# Patient Record
Sex: Female | Born: 1935 | Race: White | Hispanic: No | Smoking: Never smoker
Health system: Southern US, Community
[De-identification: ages and names within clinical notes are randomized; demographics above are authoritative.]

## PROBLEM LIST (undated history)

## (undated) DIAGNOSIS — H579 Unspecified disorder of eye and adnexa: Secondary | ICD-10-CM

## (undated) DIAGNOSIS — M84353A Stress fracture, unspecified femur, initial encounter for fracture: Secondary | ICD-10-CM

## (undated) DIAGNOSIS — M1990 Unspecified osteoarthritis, unspecified site: Secondary | ICD-10-CM

## (undated) DIAGNOSIS — H9193 Unspecified hearing loss, bilateral: Secondary | ICD-10-CM

## (undated) DIAGNOSIS — M9901 Segmental and somatic dysfunction of cervical region: Secondary | ICD-10-CM

## (undated) DIAGNOSIS — M810 Age-related osteoporosis without current pathological fracture: Secondary | ICD-10-CM

## (undated) DIAGNOSIS — I10 Essential (primary) hypertension: Secondary | ICD-10-CM

## (undated) DIAGNOSIS — Z87442 Personal history of urinary calculi: Secondary | ICD-10-CM

## (undated) HISTORY — PX: RETINAL DETACHMENT SURGERY: SHX105

## (undated) HISTORY — PX: CHOLECYSTECTOMY: SHX55

## (undated) HISTORY — DX: Unspecified osteoarthritis, unspecified site: M19.90

## (undated) HISTORY — DX: Essential (primary) hypertension: I10

## (undated) HISTORY — PX: EYE SURGERY: SHX253

---

## 1987-04-09 HISTORY — PX: OTHER SURGICAL HISTORY: SHX169

## 1998-06-05 ENCOUNTER — Other Ambulatory Visit: Admission: RE | Admit: 1998-06-05 | Discharge: 1998-06-05 | Payer: Self-pay | Admitting: *Deleted

## 1999-06-14 ENCOUNTER — Other Ambulatory Visit: Admission: RE | Admit: 1999-06-14 | Discharge: 1999-06-14 | Payer: Self-pay | Admitting: *Deleted

## 2000-07-02 ENCOUNTER — Other Ambulatory Visit: Admission: RE | Admit: 2000-07-02 | Discharge: 2000-07-02 | Payer: Self-pay | Admitting: *Deleted

## 2001-07-28 ENCOUNTER — Other Ambulatory Visit: Admission: RE | Admit: 2001-07-28 | Discharge: 2001-07-28 | Payer: Self-pay | Admitting: *Deleted

## 2002-09-23 ENCOUNTER — Ambulatory Visit (HOSPITAL_COMMUNITY): Admission: RE | Admit: 2002-09-23 | Discharge: 2002-09-24 | Payer: Self-pay | Admitting: Ophthalmology

## 2002-09-23 ENCOUNTER — Encounter: Payer: Self-pay | Admitting: Ophthalmology

## 2003-04-09 DIAGNOSIS — M84353A Stress fracture, unspecified femur, initial encounter for fracture: Secondary | ICD-10-CM

## 2003-04-09 DIAGNOSIS — H579 Unspecified disorder of eye and adnexa: Secondary | ICD-10-CM

## 2003-04-09 HISTORY — DX: Unspecified disorder of eye and adnexa: H57.9

## 2003-04-09 HISTORY — PX: ENUCLEATION: SHX628

## 2003-04-09 HISTORY — DX: Stress fracture, unspecified femur, initial encounter for fracture: M84.353A

## 2008-04-05 ENCOUNTER — Ambulatory Visit: Payer: Self-pay | Admitting: *Deleted

## 2010-08-21 NOTE — Procedures (Signed)
DUPLEX DEEP VENOUS EXAM - LOWER EXTREMITY   INDICATION:  Left leg swelling.   HISTORY:  Edema:  Several weeks of left leg edema.  Trauma/Surgery:  No.  Pain:  Left leg pain.  PE:  No.  Previous DVT:  No.  Anticoagulants:  No.  Other:   DUPLEX EXAM:                CFV   SFV   PopV  PTV    GSV                R  L  R  L  R  L  R   L  R  L  Thrombosis    0  0     0     0      0     0  Spontaneous   +  +     +     +      +     +  Phasic        +  +     +     +      +     +  Augmentation  +  +     +     +      +     +  Compressible  +  +     +     +      +     +  Competent     0  0     0     0      0     0   Legend:  + - yes  o - no  p - partial  D - decreased   IMPRESSION:  1. No evidence of deep or superficial vein thrombus or Baker's cyst      bilaterally.  2. Deep vein reflux is seen throughout the left leg and right common      femoral vein.    _____________________________  P. Liliane Bade, M.D.   MC/MEDQ  D:  04/05/2008  T:  04/05/2008  Job:  [id number]

## 2010-08-24 NOTE — Op Note (Signed)
   NAME:  Owen, Rhonda                           ACCOUNT NO.:  [id number]   MEDICAL RECORD NO.:  [id number]                   PATIENT TYPE:  OIB   LOCATION:  2550                                 FACILITY:  [facility]   PHYSICIAN:  Beulah Gandy. Ashley Royalty, M.D.              DATE OF BIRTH:  [birth year]   DATE OF PROCEDURE:  09/23/2002  DATE OF DISCHARGE:                                 OPERATIVE REPORT   PREOPERATIVE DIAGNOSIS:  Exposed and infected implant, left eye.   PROCEDURE:  1. Removal of scleral buckle material and sutures, left eye.  2. Repair of conjunctival defect, left eye.   SURGEON:  Beulah Gandy. Ashley Royalty, M.D.   ASSISTANT:  Merian Capron, MA.   ANESTHESIA:  General.   DESCRIPTION OF PROCEDURE:  After the usual prep and drape, a peritomy at 360  degree limbus peritomy, isolation of lateral rectus muscle.  Dissection down  to the scleral buckle for 360 degrees.  The scleral band was trimmed and  removed in one single piece from the 4:00 position.  The conjunctiva had  large major defects in the upper temporal and lower temporal quadrants.  Four interrupted 4-0 [place] sutures were identified and removed in their  entirety.  No additional sutures were present in the buckle path.  Scleral  buckle material was cut back in several locations and removed from the eye.  The conjunctiva was then reposited with 7-0 chromic suture.  Some of the  conjunctiva was undermined laterally, superiorly and inferiorly to advance  towards the defect edge.  7-0 chromic suture was used to close the defects  and reattach the conjunctiva to the limbus.  Once this was accomplished,  Atropine solution was applied.  The indirect ophthalmoscope laser was moved  into place and 231 burns were placed around the retinal periphery with the  power of 400 milliwatts, 1000 microns each and 0.1 seconds each.  Polymixin  and gentamycin were irrigated into tenon's space.  Atropine solution was  applied.  Decadron 10 mg was  injected into the lower subconjunctival space.  Marcaine was injected around the globe.  The closing tension was soft and  would not register on the [employer].   COMPLICATIONS:  None.   DURATION:  One hour.                                               Beulah Gandy. Ashley Royalty, M.D.    JDM/MEDQ  D:  09/23/2002  T:  09/25/2002  Job:  [id number]

## 2011-07-03 DIAGNOSIS — E785 Hyperlipidemia, unspecified: Secondary | ICD-10-CM | POA: Diagnosis not present

## 2011-07-03 DIAGNOSIS — I10 Essential (primary) hypertension: Secondary | ICD-10-CM | POA: Diagnosis not present

## 2011-07-03 DIAGNOSIS — M810 Age-related osteoporosis without current pathological fracture: Secondary | ICD-10-CM | POA: Diagnosis not present

## 2011-07-03 DIAGNOSIS — M4000 Postural kyphosis, site unspecified: Secondary | ICD-10-CM | POA: Diagnosis not present

## 2011-07-03 DIAGNOSIS — Z79899 Other long term (current) drug therapy: Secondary | ICD-10-CM | POA: Diagnosis not present

## 2011-08-14 DIAGNOSIS — Z1231 Encounter for screening mammogram for malignant neoplasm of breast: Secondary | ICD-10-CM | POA: Diagnosis not present

## 2011-09-24 DIAGNOSIS — H02059 Trichiasis without entropian unspecified eye, unspecified eyelid: Secondary | ICD-10-CM | POA: Diagnosis not present

## 2011-09-24 DIAGNOSIS — H4011X0 Primary open-angle glaucoma, stage unspecified: Secondary | ICD-10-CM | POA: Diagnosis not present

## 2012-01-28 DIAGNOSIS — Z23 Encounter for immunization: Secondary | ICD-10-CM | POA: Diagnosis not present

## 2012-03-10 DIAGNOSIS — H4011X0 Primary open-angle glaucoma, stage unspecified: Secondary | ICD-10-CM | POA: Diagnosis not present

## 2012-03-10 DIAGNOSIS — H02059 Trichiasis without entropian unspecified eye, unspecified eyelid: Secondary | ICD-10-CM | POA: Diagnosis not present

## 2012-03-16 DIAGNOSIS — Z1331 Encounter for screening for depression: Secondary | ICD-10-CM | POA: Diagnosis not present

## 2012-03-16 DIAGNOSIS — E785 Hyperlipidemia, unspecified: Secondary | ICD-10-CM | POA: Diagnosis not present

## 2012-03-16 DIAGNOSIS — I10 Essential (primary) hypertension: Secondary | ICD-10-CM | POA: Diagnosis not present

## 2012-03-16 DIAGNOSIS — Z79899 Other long term (current) drug therapy: Secondary | ICD-10-CM | POA: Diagnosis not present

## 2012-08-14 DIAGNOSIS — Z1231 Encounter for screening mammogram for malignant neoplasm of breast: Secondary | ICD-10-CM | POA: Diagnosis not present

## 2012-09-10 DIAGNOSIS — H02059 Trichiasis without entropian unspecified eye, unspecified eyelid: Secondary | ICD-10-CM | POA: Diagnosis not present

## 2012-09-10 DIAGNOSIS — H4011X0 Primary open-angle glaucoma, stage unspecified: Secondary | ICD-10-CM | POA: Diagnosis not present

## 2012-09-10 DIAGNOSIS — Z961 Presence of intraocular lens: Secondary | ICD-10-CM | POA: Diagnosis not present

## 2012-12-21 DIAGNOSIS — E785 Hyperlipidemia, unspecified: Secondary | ICD-10-CM | POA: Diagnosis not present

## 2012-12-21 DIAGNOSIS — I10 Essential (primary) hypertension: Secondary | ICD-10-CM | POA: Diagnosis not present

## 2012-12-21 DIAGNOSIS — Z79899 Other long term (current) drug therapy: Secondary | ICD-10-CM | POA: Diagnosis not present

## 2012-12-21 DIAGNOSIS — Z23 Encounter for immunization: Secondary | ICD-10-CM | POA: Diagnosis not present

## 2013-03-18 DIAGNOSIS — H4011X0 Primary open-angle glaucoma, stage unspecified: Secondary | ICD-10-CM | POA: Diagnosis not present

## 2013-08-18 DIAGNOSIS — Z1231 Encounter for screening mammogram for malignant neoplasm of breast: Secondary | ICD-10-CM | POA: Diagnosis not present

## 2013-08-24 DIAGNOSIS — N63 Unspecified lump in unspecified breast: Secondary | ICD-10-CM | POA: Diagnosis not present

## 2013-08-25 DIAGNOSIS — N63 Unspecified lump in unspecified breast: Secondary | ICD-10-CM | POA: Diagnosis not present

## 2013-08-25 DIAGNOSIS — C50919 Malignant neoplasm of unspecified site of unspecified female breast: Secondary | ICD-10-CM | POA: Diagnosis not present

## 2013-08-27 ENCOUNTER — Encounter: Payer: Self-pay | Admitting: *Deleted

## 2013-08-27 ENCOUNTER — Telehealth: Payer: Self-pay | Admitting: *Deleted

## 2013-08-27 DIAGNOSIS — C50411 Malignant neoplasm of upper-outer quadrant of right female breast: Secondary | ICD-10-CM | POA: Insufficient documentation

## 2013-08-27 NOTE — Telephone Encounter (Signed)
Confirmed [facility] for 09/01/13 at 1230.  Instructions and contact information given.

## 2013-08-31 ENCOUNTER — Telehealth: Payer: Self-pay | Admitting: *Deleted

## 2013-08-31 NOTE — Telephone Encounter (Signed)
Pt called and would like to see Dr. Lucia Gaskins as Psychologist, sport and exercise.  Pt scheduled to see Dr. Lucia Gaskins on 09/16/13.  Informed pt she does not need to come the [facility] on 09/01/13. Received verbal understanding.

## 2013-09-13 DIAGNOSIS — H40159 Residual stage of open-angle glaucoma, unspecified eye: Secondary | ICD-10-CM | POA: Diagnosis not present

## 2013-09-13 DIAGNOSIS — H26499 Other secondary cataract, unspecified eye: Secondary | ICD-10-CM | POA: Diagnosis not present

## 2013-09-16 ENCOUNTER — Encounter (INDEPENDENT_AMBULATORY_CARE_PROVIDER_SITE_OTHER): Payer: Self-pay | Admitting: Surgery

## 2013-09-16 ENCOUNTER — Ambulatory Visit (INDEPENDENT_AMBULATORY_CARE_PROVIDER_SITE_OTHER): Payer: Medicare Other | Admitting: Surgery

## 2013-09-16 VITALS — BP 126/74 | HR 74 | Temp 97.4°F | Ht <= 58 in | Wt 96.0 lb

## 2013-09-16 DIAGNOSIS — C50419 Malignant neoplasm of upper-outer quadrant of unspecified female breast: Secondary | ICD-10-CM

## 2013-09-16 DIAGNOSIS — C50411 Malignant neoplasm of upper-outer quadrant of right female breast: Secondary | ICD-10-CM

## 2013-09-16 NOTE — Progress Notes (Addendum)
Re:   Rhonda Owen DOB:   1914/05/01 MRN:   [id number]  ASSESSMENT AND PLAN: 1.  Right breast cancer, 10 o'clock  Biopsy ([id number]) - 08/25/2013 - Invasive ductal ca, ER - 100%, PR - 0%, Ki67 - 10%, Her 2 Neu - neg  I discussed the options for breast cancer treatment with the patient.  I discussed a multidisciplinary approach to the treatment of breast cancer, which includes medical oncology and radiation oncology.  I discussed the surgical options of lumpectomy vs. mastectomy.   I discussed the options of lymph node biopsy.    The risks of surgery include, but are not limited to, bleeding, infection, the need for further surgery, and nerve injury.  At her age, the less done the better.  I discussed options of 1) doing nothing, 2) being put on a pill (tamoxifen), 3) local excision of the cancer.  I would favor local excision of the cancer.  I would need some way to localize the tumor (wire or seed).  But the surgery could probably be done under local.  At this time, the patient is undecided what to do.  She is going home to think about it.  If she does not have surgery, I have encouraged her to come back to me in 6 months.  [The patient called back and wants to go ahead with surgery.  The phone number in the chart is her sister's, Florence Canner.  Ms. Kercher phone number is [phone].  I talked to Earlie Server and will will speak to Ms. Wert later today ... I spoke to Ms. Sheilds and discussed the surgery and answered questions.  Will schedule right lumpectomy (needle loc)  DN 11/29/2013]  2.  Absent left eye 3.  Cannot support head 4.  HTN 5.  Arthritis  Chief Complaint  Patient presents with  . eval breast ca   REFERRING PHYSICIAN: Gerrit Heck, MD  HISTORY OF PRESENT ILLNESS: Rhonda Owen is a 80 y.o. (DOB: [birth year])  white  female whose primary care physician is Gerrit Heck, MD and comes to me today for right breast cancer. Her sister and son, Saralyn Pilar,  came with her.  She went screening mammography at [facility] on 08/18/2013.  This prompted a biopsy of a mass found in her right breast.  The mass proved to be cancer. Biopsy ([id number]) - 08/25/2013 - Invasive ductal ca, ER - 100%, PR - 0%, Ki67 - 10%, Her 2 Neu - neg So she is here with her sister and son to discuss the options of treatment.  She lives by herself.   Past Medical History  Diagnosis Date  . Arthritis   . Hypertension     Past Surgical History  Procedure Laterality Date  . Cholecystectomy    . Eye surgery      Current Outpatient Prescriptions  Medication Sig Dispense Refill  . amLODipine (NORVASC) 5 MG tablet Take 5 mg by mouth daily.      Marland Kitchen aspirin 81 MG tablet Take 81 mg by mouth daily.      . calcium-vitamin D (OSCAL WITH D) 500-200 MG-UNIT per tablet Take 1 tablet by mouth.      . furosemide (LASIX) 20 MG tablet Take 20 mg by mouth.      . losartan (COZAAR) 50 MG tablet Take 50 mg by mouth daily.      . Multiple Vitamin (MULTIVITAMIN) tablet Take 1 tablet by mouth daily.      . raloxifene (EVISTA) 60 MG tablet Take 60  mg by mouth daily.       No current facility-administered medications for this visit.     No Known Allergies  REVIEW OF SYSTEMS: Skin:  No history of rash.  No history of abnormal moles. Infection:  No history of hepatitis or HIV.  No history of MRSA. 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 Cardiac:  No history of hypertension. No history of heart disease.  No history of prior cardiac catheterization.  No history of seeing a cardiologist. Pulmonary:  Does not smoke cigarettes.  No asthma or bronchitis.  No OSA/CPAP.  Endocrine:  No diabetes. No thyroid disease. Gastrointestinal:  No history of stomach disease.  No history of liver disease.  No history of gall bladder disease.  No history of pancreas disease.  No history of colon disease. Urologic:  No history of kidney stones.  No history of bladder infections. Musculoskeletal:  Kyphotic  and cannot support head with neck muscles.  History of stress fracture right hip - 2009. Hematologic:  No bleeding disorder.  No history of anemia.  Not anticoagulated. Psycho-social:  The patient is oriented.   Mentally she is pretty sharp.  SOCIAL and FAMILY HISTORY: Shon Baton ([phone]) and son with patient. Son Saralyn Pilar [phone]) Her sister was Zella Ball that I saw for years (who is now dead).  PHYSICAL EXAM: BP 126/74  Pulse 74  Temp(Src) 97.4 F (36.3 C)  Ht 4' 9"  (1.448 m)  Wt 96 lb (43.545 kg)  BMI 20.77 kg/m2  General: Thin older WF who is alert. Her head flops to the left and she supports it with her left hand.  HEENT: Normal. Absent left eye. Neck: Supple. No mass.  No thyroid mass. Lymph Nodes:  No supraclavicular, cervical, or axillary nodes. Lungs: Clear to auscultation and symmetric breath sounds. Heart:  RRR. No murmur or rub. Breast:  Right:  Ptotic, no mass that I can feel.  She has a puncture site at 9 o'clock.  Left:  Ptotic, no mass  Abdomen: Soft. No mass. No tenderness. No hernia. Normal bowel sounds.  Extremities:  She uses a walker. Neurologic:  Grossly intact to motor and sensory function. Psychiatric: Behavior is normal.   DATA REVIEWED: Epic notes. I gave them copies of the path report.  Alphonsa Overall, MD,  [facility], [address]  [place], [place]    [place] Phone:  [phone] FAX:  [phone]

## 2013-10-22 ENCOUNTER — Telehealth (INDEPENDENT_AMBULATORY_CARE_PROVIDER_SITE_OTHER): Payer: Self-pay

## 2013-10-22 NOTE — Telephone Encounter (Signed)
F/U call . Patient sister Florence Canner  states she is patients caregiver and she will going out of town , she will call us next month to discuss surgery options .

## 2013-11-10 ENCOUNTER — Encounter (INDEPENDENT_AMBULATORY_CARE_PROVIDER_SITE_OTHER): Payer: Self-pay

## 2013-11-29 NOTE — Addendum Note (Signed)
Addended by: Shann Medal on: 11/29/2013 01:30 PM   Modules accepted: Orders

## 2013-12-30 ENCOUNTER — Encounter (HOSPITAL_COMMUNITY): Payer: Self-pay | Admitting: Pharmacy Technician

## 2013-12-31 ENCOUNTER — Ambulatory Visit (HOSPITAL_COMMUNITY)
Admission: RE | Admit: 2013-12-31 | Discharge: 2013-12-31 | Disposition: A | Discharge status: Home / Self Care | Payer: Medicare Other | Source: Ambulatory Visit | Attending: Anesthesiology | Admitting: Anesthesiology

## 2013-12-31 ENCOUNTER — Encounter (HOSPITAL_COMMUNITY): Payer: Self-pay

## 2013-12-31 ENCOUNTER — Encounter (HOSPITAL_COMMUNITY)
Admission: RE | Admit: 2013-12-31 | Discharge: 2013-12-31 | Disposition: A | Discharge status: Home / Self Care | Payer: Medicare Other | Source: Ambulatory Visit | Attending: Surgery | Admitting: Surgery

## 2013-12-31 DIAGNOSIS — M8448XA Pathological fracture, other site, initial encounter for fracture: Secondary | ICD-10-CM | POA: Diagnosis not present

## 2013-12-31 DIAGNOSIS — C50919 Malignant neoplasm of unspecified site of unspecified female breast: Secondary | ICD-10-CM | POA: Diagnosis not present

## 2013-12-31 HISTORY — DX: Stress fracture, unspecified femur, initial encounter for fracture: M84.353A

## 2013-12-31 HISTORY — DX: Unspecified disorder of eye and adnexa: H57.9

## 2013-12-31 HISTORY — DX: Unspecified hearing loss, bilateral: H91.93

## 2013-12-31 HISTORY — DX: Age-related osteoporosis without current pathological fracture: M81.0

## 2013-12-31 HISTORY — DX: Segmental and somatic dysfunction of cervical region: M99.01

## 2013-12-31 HISTORY — DX: Personal history of urinary calculi: Z87.442

## 2013-12-31 LAB — CBC
HCT: 37.4 % (ref 36.0–46.0)
Hemoglobin: 12.4 g/dL (ref 12.0–15.0)
MCH: 30.6 pg (ref 26.0–34.0)
MCHC: 33.2 g/dL (ref 30.0–36.0)
MCV: 92.3 fL (ref 78.0–100.0)
Platelets: 236 10*3/uL (ref 150–400)
RBC: 4.05 MIL/uL (ref 3.87–5.11)
RDW: 14.2 % (ref 11.5–15.5)
WBC: 6.5 10*3/uL (ref 4.0–10.5)

## 2013-12-31 LAB — BASIC METABOLIC PANEL
Anion gap: 9 (ref 5–15)
BUN: 17 mg/dL (ref 6–23)
CO2: 30 mEq/L (ref 19–32)
CREATININE: 0.67 mg/dL (ref 0.50–1.10)
Calcium: 9.8 mg/dL (ref 8.4–10.5)
Chloride: 101 mEq/L (ref 96–112)
GFR calc Af Amer: 83 mL/min — ABNORMAL LOW (ref >90)
GFR, EST NON AFRICAN AMERICAN: 72 mL/min — AB (ref >90)
Glucose, Bld: 90 mg/dL (ref 70–99)
Potassium: 4.4 mEq/L (ref 3.7–5.3)
SODIUM: 140 meq/L (ref 137–147)

## 2013-12-31 NOTE — Pre-Procedure Instructions (Signed)
Rhonda Owen  12/31/2013   Your procedure is scheduled on: Friday, January 07, 2014  Report to [facility] Admitting at 9:00 AM.  Call this number if you have problems the morning of surgery: [phone]   Remember:   Do not eat food or drink liquids after midnight Thursday, January 06, 2014   Take these medicines the morning of surgery with A SIP OF WATER: amLODipine (NORVASC),  raloxifene (EVISTA)   Stop taking Aspirin, vitamins, and herbal medications. Do not take any NSAIDs ie: Ibuprofen, Advil, Naproxen or any medication containing Aspirin; stop now.   Do not wear jewelry, make-up or nail polish.  Do not wear lotions, powders, or perfumes. You may not wear deodorant.  Do not shave 48 hours prior to surgery. Men may shave face and neck.  Do not bring valuables to the hospital.  [facility] is not responsible  for any belongings or valuables.               Contacts, dentures or bridgework may not be worn into surgery.  Leave suitcase in the car. After surgery it may be brought to your room.  For patients admitted to the hospital, discharge time is determined by your treatment team.               Patients discharged the day of surgery will not be allowed to drive home.  Name and phone number of your driver:       Special Instructions:  Special Instructions:Special Instructions: [facility] - Preparing for Surgery  Before surgery, you can play an important role.  Because skin is not sterile, your skin needs to be as free of germs as possible.  You can reduce the number of germs on you skin by washing with CHG (chlorahexidine gluconate) soap before surgery.  CHG is an antiseptic cleaner which kills germs and bonds with the skin to continue killing germs even after washing.  Please DO NOT use if you have an allergy to CHG or antibacterial soaps.  If your skin becomes reddened/irritated stop using the CHG and inform your nurse when you arrive at Short Stay.  Do not shave  (including legs and underarms) for at least 48 hours prior to the first CHG shower.  You may shave your face.  Please follow these instructions carefully:   1.  Shower with CHG Soap the night before surgery and the morning of Surgery.  2.  If you choose to wash your hair, wash your hair first as usual with your normal shampoo.  3.  After you shampoo, rinse your hair and body thoroughly to remove the Shampoo.  4.  Use CHG as you would any other liquid soap.  You can apply chg directly  to the skin and wash gently with scrungie or a clean washcloth.  5.  Apply the CHG Soap to your body ONLY FROM THE NECK DOWN.  Do not use on open wounds or open sores.  Avoid contact with your eyes, ears, mouth and genitals (private parts).  Wash genitals (private parts) with your normal soap.  6.  Wash thoroughly, paying special attention to the area where your surgery will be performed.  7.  Thoroughly rinse your body with warm water from the neck down.  8.  DO NOT shower/wash with your normal soap after using and rinsing off the CHG Soap.  9.  Pat yourself dry with a clean towel.  10.  Wear clean pajamas.            11.  Place clean sheets on your bed the night of your first shower and do not sleep with pets.  Day of Surgery  Do not apply any lotions/deodorants the morning of surgery.  Please wear clean clothes to the hospital/surgery center.   Please read over the following fact sheets that you were given: Pain Booklet, Coughing and Deep Breathing and Surgical Site Infection Prevention

## 2014-01-03 NOTE — Progress Notes (Signed)
Anesthesia Chart Review:  Patient is a 80 year old female scheduled for right breast lumpectomy (needle localization) on 01/07/14 by Dr. Alphonsa Overall. According to his 09/16/13 note, "At her age, the less done the better. I discussed options of 1) doing nothing, 2) being put on a pill (tamoxifen), 3) local excision of the cancer. I would favor local excision of the cancer. I would need some way to localize the tumor (wire or seed). But the surgery could probably be done under local."  (Procedure is posted for MAC.) She contacted him on 11/29/13 to let him know that she had decided to proceed with surgery.    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 BMI 20. PCP is Dr. Leighton Ruff with [facility].    Meds: MVI, Travoprost ophthalmic, amlodipine, ASA 81 mg, Oscal with D, Lasix, losartan, Evista.    CXR on 12/31/13 showed: Bones osteoporotic with osteoporotic type compression fractures in the thoracic spine. Marked increased kyphosis. Lungs clear. No apparent adenopathy.  Preoperative labs noted.   EKG on 12/31/13 showed: SR with PACs, LAD, minimal voltage criteria for LVH, possible anterior infarct (age undetermined)--confirmed by interpreting cardiologist Dr. Lauree Chandler.  No CV symptoms were documented at her PAT visit or [facility] visit with Dr. Lucia Gaskins. Since I think her EKG now has an incomplete left BBB type pattern since her 09/23/02 EKG in [place] (no other old EKGs received from [place]), I reviewed above with anesthesiologist Dr. Therisa Doyne. He felt that if patient remains without new CV symptoms then it is anticipated that she can proceed as planned.  As above, local anesthesia is anticipated; however, definitive anesthesia plan on the day of surgery  following evaluation by her assigned anesthesiologist.   Myra Gianotti, PA-C [facility] Short Stay Center/Anesthesiology Phone [phone] 01/03/2014 12:35 PM

## 2014-01-06 MED ORDER — CEFAZOLIN SODIUM-DEXTROSE 2-3 GM-% IV SOLR
2.0000 g | INTRAVENOUS | Status: AC
  Administered 2014-01-07: 2 g via INTRAVENOUS
  Filled 2014-01-06: qty 50

## 2014-01-07 ENCOUNTER — Ambulatory Visit (HOSPITAL_COMMUNITY)
Admission: RE | Admit: 2014-01-07 | Discharge: 2014-01-07 | Disposition: A | Discharge status: Home / Self Care | Payer: Medicare Other | Source: Ambulatory Visit | Attending: Surgery | Admitting: Surgery

## 2014-01-07 ENCOUNTER — Ambulatory Visit (HOSPITAL_COMMUNITY): Payer: Medicare Other | Admitting: Anesthesiology

## 2014-01-07 ENCOUNTER — Encounter (HOSPITAL_COMMUNITY): Payer: Self-pay | Admitting: *Deleted

## 2014-01-07 ENCOUNTER — Encounter (HOSPITAL_COMMUNITY): Payer: Medicare Other | Admitting: Vascular Surgery

## 2014-01-07 ENCOUNTER — Encounter (HOSPITAL_COMMUNITY)
Admission: RE | Disposition: A | Discharge status: Home / Self Care | Payer: Self-pay | Source: Ambulatory Visit | Attending: Surgery

## 2014-01-07 DIAGNOSIS — Z9001 Acquired absence of eye: Secondary | ICD-10-CM | POA: Diagnosis not present

## 2014-01-07 DIAGNOSIS — I10 Essential (primary) hypertension: Secondary | ICD-10-CM | POA: Insufficient documentation

## 2014-01-07 DIAGNOSIS — C50911 Malignant neoplasm of unspecified site of right female breast: Secondary | ICD-10-CM | POA: Diagnosis not present

## 2014-01-07 DIAGNOSIS — R920 Mammographic microcalcification found on diagnostic imaging of breast: Secondary | ICD-10-CM | POA: Insufficient documentation

## 2014-01-07 DIAGNOSIS — N63 Unspecified lump in breast: Secondary | ICD-10-CM | POA: Diagnosis not present

## 2014-01-07 DIAGNOSIS — C50411 Malignant neoplasm of upper-outer quadrant of right female breast: Secondary | ICD-10-CM

## 2014-01-07 DIAGNOSIS — R928 Other abnormal and inconclusive findings on diagnostic imaging of breast: Secondary | ICD-10-CM | POA: Diagnosis not present

## 2014-01-07 DIAGNOSIS — M1380 Other specified arthritis, unspecified site: Secondary | ICD-10-CM | POA: Insufficient documentation

## 2014-01-07 HISTORY — PX: BREAST LUMPECTOMY WITH NEEDLE LOCALIZATION: SHX5759

## 2014-01-07 SURGERY — BREAST LUMPECTOMY WITH NEEDLE LOCALIZATION
Anesthesia: Monitor Anesthesia Care | Site: Breast | Laterality: Right

## 2014-01-07 MED ORDER — PROPOFOL 10 MG/ML IV BOLUS
INTRAVENOUS | Status: AC
  Filled 2014-01-07: qty 20

## 2014-01-07 MED ORDER — FENTANYL CITRATE 0.05 MG/ML IJ SOLN
INTRAMUSCULAR | Status: DC | PRN
  Administered 2014-01-07 (×3): 50 ug via INTRAVENOUS

## 2014-01-07 MED ORDER — PROPOFOL INFUSION 10 MG/ML OPTIME
INTRAVENOUS | Status: DC | PRN
  Administered 2014-01-07: 50 ug/kg/min via INTRAVENOUS

## 2014-01-07 MED ORDER — LIDOCAINE-EPINEPHRINE 0.5 %-1:200000 IJ SOLN
INTRAMUSCULAR | Status: DC | PRN
  Administered 2014-01-07: 50 mL

## 2014-01-07 MED ORDER — CHLORHEXIDINE GLUCONATE 4 % EX LIQD
1.0000 "application " | Freq: Once | CUTANEOUS | Status: DC
  Filled 2014-01-07: qty 15

## 2014-01-07 MED ORDER — BUPIVACAINE HCL (PF) 0.25 % IJ SOLN
INTRAMUSCULAR | Status: AC
  Filled 2014-01-07: qty 30

## 2014-01-07 MED ORDER — ONDANSETRON HCL 4 MG/2ML IJ SOLN
INTRAMUSCULAR | Status: AC
  Filled 2014-01-07: qty 2

## 2014-01-07 MED ORDER — LIDOCAINE-EPINEPHRINE 0.5 %-1:200000 IJ SOLN
INTRAMUSCULAR | Status: AC
  Filled 2014-01-07: qty 1

## 2014-01-07 MED ORDER — LIDOCAINE HCL (CARDIAC) 20 MG/ML IV SOLN
INTRAVENOUS | Status: DC | PRN
  Administered 2014-01-07: 20 mg via INTRAVENOUS

## 2014-01-07 MED ORDER — LACTATED RINGERS IV SOLN
INTRAVENOUS | Status: DC
  Administered 2014-01-07: 11:00:00 via INTRAVENOUS

## 2014-01-07 MED ORDER — BUPIVACAINE HCL 0.25 % IJ SOLN
INTRAMUSCULAR | Status: DC | PRN
Start: 2014-01-07 — End: 2014-01-07
  Administered 2014-01-07: 10 mL

## 2014-01-07 MED ORDER — 0.9 % SODIUM CHLORIDE (POUR BTL) OPTIME
TOPICAL | Status: DC | PRN
  Administered 2014-01-07: 1000 mL

## 2014-01-07 MED ORDER — ONDANSETRON HCL 4 MG/2ML IJ SOLN
INTRAMUSCULAR | Status: DC | PRN
  Administered 2014-01-07 (×2): 2 mg via INTRAVENOUS

## 2014-01-07 MED ORDER — FENTANYL CITRATE 0.05 MG/ML IJ SOLN: 25.0000 ug | INTRAMUSCULAR | Status: DC | PRN

## 2014-01-07 MED ORDER — FENTANYL CITRATE 0.05 MG/ML IJ SOLN
INTRAMUSCULAR | Status: AC
  Filled 2014-01-07: qty 5

## 2014-01-07 SURGICAL SUPPLY — 49 items
BENZOIN TINCTURE PRP APPL 2/3 (GAUZE/BANDAGES/DRESSINGS) IMPLANT
BINDER BREAST LRG (GAUZE/BANDAGES/DRESSINGS) IMPLANT
BINDER BREAST MEDIUM (GAUZE/BANDAGES/DRESSINGS) ×3 IMPLANT
BINDER BREAST XLRG (GAUZE/BANDAGES/DRESSINGS) IMPLANT
CANISTER SUCTION 2500CC (MISCELLANEOUS) ×3 IMPLANT
CHLORAPREP W/TINT 26ML (MISCELLANEOUS) ×3 IMPLANT
CLIP TI WIDE RED SMALL 6 (CLIP) ×3 IMPLANT
CLOSURE WOUND 1/4X4 (GAUZE/BANDAGES/DRESSINGS)
CONT SPEC 4OZ CLIKSEAL STRL BL (MISCELLANEOUS) ×3 IMPLANT
COVER SURGICAL LIGHT HANDLE (MISCELLANEOUS) ×3 IMPLANT
DECANTER SPIKE VIAL GLASS SM (MISCELLANEOUS) ×3 IMPLANT
DERMABOND ADVANCED (GAUZE/BANDAGES/DRESSINGS) ×2
DERMABOND ADVANCED .7 DNX12 (GAUZE/BANDAGES/DRESSINGS) ×1 IMPLANT
DEVICE DUBIN SPECIMEN MAMMOGRA (MISCELLANEOUS) ×3 IMPLANT
DRAPE CHEST BREAST 15X10 FENES (DRAPES) ×3 IMPLANT
DRAPE UTILITY 15X26 W/TAPE STR (DRAPE) ×6 IMPLANT
ELECT COATED BLADE 2.86 ST (ELECTRODE) ×3 IMPLANT
ELECT REM PT RETURN 9FT ADLT (ELECTROSURGICAL) ×3
ELECTRODE REM PT RTRN 9FT ADLT (ELECTROSURGICAL) ×1 IMPLANT
GAUZE SPONGE 4X4 12PLY STRL (GAUZE/BANDAGES/DRESSINGS) ×3 IMPLANT
GLOVE BIO SURGEON STRL SZ7 (GLOVE) ×3 IMPLANT
GLOVE BIOGEL PI IND STRL 7.0 (GLOVE) ×2 IMPLANT
GLOVE BIOGEL PI INDICATOR 7.0 (GLOVE) ×4
GLOVE SURG SIGNA 7.5 PF LTX (GLOVE) ×6 IMPLANT
GLOVE SURG SS PI 7.0 STRL IVOR (GLOVE) ×3 IMPLANT
GOWN STRL REUS W/ TWL LRG LVL3 (GOWN DISPOSABLE) ×1 IMPLANT
GOWN STRL REUS W/ TWL XL LVL3 (GOWN DISPOSABLE) ×1 IMPLANT
GOWN STRL REUS W/TWL LRG LVL3 (GOWN DISPOSABLE) ×2
GOWN STRL REUS W/TWL XL LVL3 (GOWN DISPOSABLE) ×2
KIT BASIN OR (CUSTOM PROCEDURE TRAY) ×3 IMPLANT
KIT MARKER MARGIN INK (KITS) ×3 IMPLANT
KIT ROOM TURNOVER OR (KITS) ×3 IMPLANT
NEEDLE HYPO 25GX1X1/2 BEV (NEEDLE) ×6 IMPLANT
NS IRRIG 1000ML POUR BTL (IV SOLUTION) ×3 IMPLANT
PACK SURGICAL SETUP 50X90 (CUSTOM PROCEDURE TRAY) ×3 IMPLANT
PAD ARMBOARD 7.5X6 YLW CONV (MISCELLANEOUS) ×3 IMPLANT
PENCIL BUTTON HOLSTER BLD 10FT (ELECTRODE) ×3 IMPLANT
SPONGE LAP 4X18 X RAY DECT (DISPOSABLE) ×3 IMPLANT
STRIP CLOSURE SKIN 1/4X4 (GAUZE/BANDAGES/DRESSINGS) IMPLANT
SUT MON AB 5-0 PS2 18 (SUTURE) ×3 IMPLANT
SUT VIC AB 3-0 SH 18 (SUTURE) ×3 IMPLANT
SYR BULB 3OZ (MISCELLANEOUS) ×3 IMPLANT
SYR CONTROL 10ML LL (SYRINGE) ×6 IMPLANT
TAPE CLOTH SURG 4X10 WHT LF (GAUZE/BANDAGES/DRESSINGS) ×3 IMPLANT
TOWEL OR 17X24 6PK STRL BLUE (TOWEL DISPOSABLE) ×3 IMPLANT
TOWEL OR 17X26 10 PK STRL BLUE (TOWEL DISPOSABLE) ×3 IMPLANT
TUBE CONNECTING 12'X1/4 (SUCTIONS) ×1
TUBE CONNECTING 12X1/4 (SUCTIONS) ×2 IMPLANT
YANKAUER SUCT BULB TIP NO VENT (SUCTIONS) ×3 IMPLANT

## 2014-01-07 NOTE — Anesthesia Preprocedure Evaluation (Addendum)
Anesthesia Evaluation  Patient identified by MRN, date of birth, ID band Patient awake    Reviewed: Allergy & Precautions, H&P , NPO status , Patient's Chart, lab work & pertinent test results  Airway Mallampati: I  Neck ROM: Limited    Dental  (+) Edentulous Upper   Pulmonary  breath sounds clear to auscultation        Cardiovascular hypertension, Rhythm:Regular Rate:Normal     Neuro/Psych    GI/Hepatic   Endo/Other    Renal/GU      Musculoskeletal  (+) Arthritis -,   Abdominal   Peds  Hematology   Anesthesia Other Findings   Reproductive/Obstetrics                          Anesthesia Physical Anesthesia Plan  ASA: II  Anesthesia Plan: MAC   Post-op Pain Management:    Induction: Intravenous  Airway Management Planned: Natural Airway  Additional Equipment:   Intra-op Plan:   Post-operative Plan:   Informed Consent: I have reviewed the patients History and Physical, chart, labs and discussed the procedure including the risks, benefits and alternatives for the proposed anesthesia with the patient or authorized representative who has indicated his/her understanding and acceptance.   Dental advisory given  Plan Discussed with: CRNA and Surgeon  Anesthesia Plan Comments:         Anesthesia Quick Evaluation

## 2014-01-07 NOTE — Transfer of Care (Signed)
Immediate Anesthesia Transfer of Care Note  Patient: Rhonda Owen  Procedure(s) Performed: Procedure(s): RIGHT BREAST LUMPECTOMY WITH NEEDLE LOCALIZATION (Right)  Patient Location: PACU  Anesthesia Type:MAC  Level of Consciousness: awake  Airway & Oxygen Therapy: Patient Spontanous Breathing and Patient connected to nasal cannula oxygen  Post-op Assessment: Report given to PACU RN and Post -op Vital signs reviewed and stable  Post vital signs: stable  Complications: No apparent anesthesia complications

## 2014-01-07 NOTE — Discharge Instructions (Signed)
[facility] - DISCHARGE INSTRUCTIONS TO PATIENT  Wound Care:   Leave incision dry x 48 hours, then may shower or wash wound  Diet:  As tolerated  Follow up appointment:  Call Dr. Pollie Friar office [facility]) at [phone] for an appointment in 1 to 4 weeks  Medications and dosages:  Resume your home medications.  Call Dr. Lucia Gaskins or his office  [phone]) if you have:  Temperature greater than 100.4,  Persistent nausea and vomiting,  Severe uncontrolled pain,  Redness, tenderness, or signs of infection (pain, swelling, redness, odor or green/yellow discharge around the site),  Difficulty breathing, headache or visual disturbances,  Any other questions or concerns you may have after discharge.  In an emergency, call 911 or go to an Emergency Department at a nearby hospital.  What to eat:  For your first meals, you should eat lightly; only small meals initially.  If you do not have nausea, you may eat larger meals.  Avoid spicy, greasy and heavy food.    General Anesthesia, Adult, Care After  Refer to this sheet in the next few weeks. These instructions provide you with information on caring for yourself after your procedure. Your health care provider may also give you more specific instructions. Your treatment has been planned according to current medical practices, but problems sometimes occur. Call your health care provider if you have any problems or questions after your procedure.  WHAT TO EXPECT AFTER THE PROCEDURE  After the procedure, it is typical to experience:  Sleepiness.  Nausea and vomiting. HOME CARE INSTRUCTIONS  For the first 24 hours after general anesthesia:  Have a responsible person with you.  Do not drive a car. If you are alone, do not take public transportation.  Do not drink alcohol.  Do not take medicine that has not been prescribed by your health care provider.  Do not sign important papers or make important decisions.  You  may resume a normal diet and activities as directed by your health care provider.  Change bandages (dressings) as directed.  If you have questions or problems that seem related to general anesthesia, call the hospital and ask for the anesthetist or anesthesiologist on call. SEEK MEDICAL CARE IF:  You have nausea and vomiting that continue the day after anesthesia.  You develop a rash. SEEK IMMEDIATE MEDICAL CARE IF:  You have difficulty breathing.  You have chest pain.  You have any allergic problems. Document Released: 07/01/2000 Document Revised: 11/25/2012 Document Reviewed: 10/08/2012  [facility] Patient Information 2014 [place], [place].   Tissue Adhesive Wound Care    Some cuts and wounds can be closed with tissue adhesive. Adhesive is like glue. It holds the skin together and helps a wound heal faster. This adhesive goes away on its own as the wound heals.  HOME CARE  Showers are allowed. Do not soak the wound in water. Do not take baths, swim, or use hot tubs. Do not use soaps or creams on your wound.  If a bandage (dressing) was put on, change it as often as told by your doctor.  Keep the bandage dry.  Do not scratch, pick, or rub the adhesive.  Do not put tape over the adhesive. The adhesive could come off.  Protect the wound from another injury.  Protect the wound from sun and tanning beds.  Only take medicine as told by your doctor.  Keep all doctor visits as told. GET HELP RIGHT AWAY IF:  Your wound is red,  puffy (swollen), hot, or tender.  You get a rash after the glue is put on.  You have more pain in the wound.  You have a red streak going away from the wound.  You have yellowish-white fluid (pus) coming from the wound.  You have more bleeding.  You have a fever.  You have chills and start to shake.  You notice a bad smell coming from the wound.  Your wound or adhesive breaks open. MAKE SURE YOU:  Understand these instructions.  Will watch your condition.  Will  get help right away if you are not doing well or get worse. Document Released: 01/02/2008 Document Revised: 01/13/2013 Document Reviewed: 10/14/2012  [facility] Patient Information 2015 [place], [place]. This information is not intended to replace advice given to you by your health care provider. Make sure you discuss any questions you have with your health care provider.

## 2014-01-07 NOTE — Anesthesia Postprocedure Evaluation (Signed)
  Anesthesia Post-op Note  Patient: Rhonda Owen  Procedure(s) Performed: Procedure(s): RIGHT BREAST LUMPECTOMY WITH NEEDLE LOCALIZATION (Right)  Patient Location: PACU  Anesthesia Type: MAC   Level of Consciousness: awake, alert  and oriented  Airway and Oxygen Therapy: Patient Spontanous Breathing  Post-op Pain: mild  Post-op Assessment: Post-op Vital signs reviewed  Post-op Vital Signs: Reviewed  Last Vitals:  Filed Vitals:   01/07/14 1414  BP: 141/61  Pulse: 66  Temp:   Resp:     Complications: No apparent anesthesia complications

## 2014-01-07 NOTE — Op Note (Signed)
01/07/2014  1:28 PM  PATIENT:  Rhonda Owen DOB: [birth year] MRN: [id number]  PREOP DIAGNOSIS:  right breast cancer  POSTOP DIAGNOSIS:   Right breast cancer  , 10 o'clock position (T1, N0)  PROCEDURE:   Procedure(s):  RIGHT BREAST LUMPECTOMY WITH NEEDLE LOCALIZATION  SURGEON:   Alphonsa Overall, M.D.  ANESTHESIA:   MAC  Anesthesiologist: Napoleon Form, MD; Rica Koyanagi, MD CRNA: Ignacia Bayley, CRNA  Monitor Anesthesia Care  EBL:  minimal  ml  LOCAL MEDICATIONS USED:   24 cc of 1/2% xylocaine with epi, 10 cc 1/2% marcaine  SPECIMEN:   Right breast lumpectomy, superior margin  COUNTS CORRECT:  YES  INDICATIONS FOR PROCEDURE:  Rhonda Owen is a 80 y.o. (DOB: 1914-10-08) white  female whose primary care physician is Gerrit Heck, MD and comes for right breast lumpectomy.   She sees Dr. Mady Haagensen as her PCP.  We discussed the options of treating her right breast cancer - doing nothing vs taking a pill vs primary surgical resection.  She wanted to have the cancer removed.   Options for breast cancer treatment were discussed with the patient. She elected to proceed with lumpectomy and axillary sentinel lymph node.     The indications and potential complications of surgery were explained to the patient. Potential complications include, but are not limited to, bleeding, infection, the need for further surgery, and nerve injury.     The patient had a needle loc wire placed at the 10 o'clock position of the right breast at [facility] by Dr. Marcelo Baldy.    OPERATIVE NOTE:   The patient was taken to room # 2 at [facility] where she underwent a MAC supervised by Anesthesiologist: Napoleon Form, MD; Rica Koyanagi, MD CRNA: Ignacia Bayley, CRNA. Her right breast and axilla were prepped with  ChloraPrep and sterilely draped.    A time-out and the surgical check list was reviewed.    The cancer which was about at the 10 o'clock position of the right breast and there was a wire which was  3 cm away from the tumor coming out of the skin at 9 o'clock in the right breast. I cut down around the wire and tried to take an ellipse of breast tissue around the tumor by at least 1 cm.   I excised this block of breast tissue approximately 4 cm by 4 cm  in diameter. I did take the dissection down to the pectoralis major. I painted the lumpectomy specimen with the 6 color paint kit, placed a suture medially, and did a specimen mammogram which confirmed the mass, clip and the wire were all in the right position.    I excised some additional tissue from the superior margin.  I painted the extra excision and placed a suture medially.   I then irrigated the wound with saline. I infiltrated approximately 30 mL of 1% local between the  Incisions.   I then closed all the wounds in layers using 3-0 Vicryl sutures for the deep layer. At the skin, I closed the incisions with a 5-0 Monocryl suture. The incisions were then painted with Dermabond.  She had gauze place over the wounds and placed in a breast binder.   The patient tolerated the procedure well, was transported to the recovery room in good condition. Sponge and needle count were correct at the end of the case.   Final pathology is pending.   Alphonsa Overall, MD, [facility] Surgery Pager: [phone] Office  phone:  [phone]

## 2014-01-07 NOTE — H&P (Addendum)
Re: Rhonda Owen  DOB: 1914/11/06  MRN: [id number]   ASSESSMENT AND PLAN:  1. Right breast cancer, 10 o'clock   Biopsy ([id number]) - 08/25/2013 - Invasive ductal ca, ER - 100%, PR - 0%, Ki67 - 10%, Her 2 Neu - neg   I discussed the options for breast cancer treatment with the patient. I discussed a multidisciplinary approach to the treatment of breast cancer, which includes medical oncology and radiation oncology. I discussed the surgical options of lumpectomy vs. mastectomy. I discussed the options of lymph node biopsy.   The risks of surgery include, but are not limited to, bleeding, infection, the need for further surgery, and nerve injury.   At her age, the less done the better. I discussed options of 1) doing nothing, 2) being put on a pill (tamoxifen), 3) local excision of the cancer. I would favor local excision of the cancer. I would need some way to localize the tumor (wire or seed). But the surgery could probably be done under local.  At this time, the patient is undecided what to do. She is going home to think about it. If she does not have surgery, I have encouraged her to come back to me in 6 months.   [The patient called back and wants to go ahead with surgery. The phone number in the chart is her sister's, Rhonda Owen. Ms. Delahoz phone number is [phone]. I talked to Earlie Server and will will speak to Ms. Lorino later today ... I spoke to Ms. Sheilds and discussed the surgery and answered questions. Will schedule right lumpectomy (needle loc) DN 11/29/2013]   Her daughter and son are with her today.  2. Absent left eye  3. Cannot support head  4. HTN  5. Arthritis   Chief Complaint   Patient presents with   .  eval breast ca    REFERRING PHYSICIAN: Gerrit Heck, MD   HISTORY OF PRESENT ILLNESS:  Rhonda Owen is a 80 y.o. (DOB: 1915/03/07) white female whose primary care physician is Gerrit Heck, MD and comes to me today for right breast  cancer.  Her sister and son, Rhonda Owen, came with her.   She went screening mammography at [facility] on 08/18/2013. This prompted a biopsy of a mass found in her right breast. The mass proved to be cancer.  Biopsy ([id number]) - 08/25/2013 - Invasive ductal ca, ER - 100%, PR - 0%, Ki67 - 10%, Her 2 Neu - neg  So she is here with her sister and son to discuss the options of treatment. She lives by herself.   Past Medical History   Diagnosis  Date   .  Arthritis    .  Hypertension     Past Surgical History   Procedure  Laterality  Date   .  Cholecystectomy     .  Eye surgery      Current Outpatient Prescriptions   Medication  Sig  Dispense  Refill   .  amLODipine (NORVASC) 5 MG tablet  Take 5 mg by mouth daily.     Marland Kitchen  aspirin 81 MG tablet  Take 81 mg by mouth daily.     .  calcium-vitamin D (OSCAL WITH D) 500-200 MG-UNIT per tablet  Take 1 tablet by mouth.     .  furosemide (LASIX) 20 MG tablet  Take 20 mg by mouth.     .  losartan (COZAAR) 50 MG tablet  Take 50 mg by mouth daily.     Marland Kitchen  Multiple Vitamin (MULTIVITAMIN) tablet  Take 1 tablet by mouth daily.     .  raloxifene (EVISTA) 60 MG tablet  Take 60 mg by mouth daily.      No current facility-administered medications for this visit.   No Known Allergies   REVIEW OF SYSTEMS:  Skin: No history of rash. No history of abnormal moles.  Infection: No history of hepatitis or HIV. No history of MRSA.  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  Cardiac: No history of hypertension. No history of heart disease. No history of prior cardiac catheterization. No history of seeing a cardiologist.  Pulmonary: Does not smoke cigarettes. No asthma or bronchitis. No OSA/CPAP.  Endocrine: No diabetes. No thyroid disease.  Gastrointestinal: No history of stomach disease. No history of liver disease. No history of gall bladder disease. No history of pancreas disease. No history of colon disease.  Urologic: No history of kidney stones. No history of  bladder infections.  Musculoskeletal: Kyphotic and cannot support head with neck muscles. History of stress fracture right hip - 2009.  Hematologic: No bleeding disorder. No history of anemia. Not anticoagulated.  Psycho-social: The patient is oriented. Mentally she is pretty sharp.   SOCIAL and FAMILY HISTORY:  Sister, Rhonda Owen, and son with patient.  Son Rhonda Owen [phone])  Her sister was Zella Ball that I saw for years (who is now dead).   PHYSICAL EXAM:  BP 126/74  Pulse 74  Temp(Src) 97.4 F (36.3 C)  Ht 4' 9"  (1.448 m)  Wt 96 lb (43.545 kg)  BMI 20.77 kg/m2  General: Thin older WF who is alert. Her head flops to the left and she supports it with her left hand.  HEENT: Normal. Absent left eye.  Neck: Supple. No mass. No thyroid mass.  Lymph Nodes: No supraclavicular, cervical, or axillary nodes.  Lungs: Clear to auscultation and symmetric breath sounds.  Heart: RRR. No murmur or rub.  Breast: Right: Ptotic, no mass that I can feel. She has a puncture site at 9 o'clock.  Left: Ptotic, no mass  Abdomen: Soft. No mass. No tenderness. No hernia. Normal bowel sounds.  Extremities: She uses a walker.  Neurologic: Grossly intact to motor and sensory function.  Psychiatric: Behavior is normal.   DATA REVIEWED:  Epic notes.  I gave them copies of the path report.  Alphonsa Overall, MD, [facility] Surgery, [address] [place], [place]  Phone: [phone] FAX: [phone]

## 2014-01-10 ENCOUNTER — Encounter (HOSPITAL_COMMUNITY): Payer: Self-pay | Admitting: Surgery

## 2014-01-10 LAB — SURGICAL PATHOLOGY

## 2014-02-02 DIAGNOSIS — Z79899 Other long term (current) drug therapy: Secondary | ICD-10-CM | POA: Diagnosis not present

## 2014-02-02 DIAGNOSIS — M810 Age-related osteoporosis without current pathological fracture: Secondary | ICD-10-CM | POA: Diagnosis not present

## 2014-02-02 DIAGNOSIS — C50911 Malignant neoplasm of unspecified site of right female breast: Secondary | ICD-10-CM | POA: Diagnosis not present

## 2014-02-02 DIAGNOSIS — Z23 Encounter for immunization: Secondary | ICD-10-CM | POA: Diagnosis not present

## 2014-02-02 DIAGNOSIS — R634 Abnormal weight loss: Secondary | ICD-10-CM | POA: Diagnosis not present

## 2014-02-02 DIAGNOSIS — E785 Hyperlipidemia, unspecified: Secondary | ICD-10-CM | POA: Diagnosis not present

## 2014-02-02 DIAGNOSIS — I10 Essential (primary) hypertension: Secondary | ICD-10-CM | POA: Diagnosis not present

## 2014-03-16 DIAGNOSIS — H409 Unspecified glaucoma: Secondary | ICD-10-CM | POA: Diagnosis not present

## 2014-07-07 DIAGNOSIS — R946 Abnormal results of thyroid function studies: Secondary | ICD-10-CM | POA: Diagnosis not present

## 2014-07-07 DIAGNOSIS — Z79899 Other long term (current) drug therapy: Secondary | ICD-10-CM | POA: Diagnosis not present

## 2014-07-07 DIAGNOSIS — I10 Essential (primary) hypertension: Secondary | ICD-10-CM | POA: Diagnosis not present

## 2014-07-07 DIAGNOSIS — R7989 Other specified abnormal findings of blood chemistry: Secondary | ICD-10-CM | POA: Diagnosis not present

## 2014-07-07 DIAGNOSIS — Z853 Personal history of malignant neoplasm of breast: Secondary | ICD-10-CM | POA: Diagnosis not present

## 2014-07-07 DIAGNOSIS — Z7189 Other specified counseling: Secondary | ICD-10-CM | POA: Diagnosis not present

## 2014-07-07 DIAGNOSIS — E780 Pure hypercholesterolemia: Secondary | ICD-10-CM | POA: Diagnosis not present

## 2014-08-16 DIAGNOSIS — R21 Rash and other nonspecific skin eruption: Secondary | ICD-10-CM | POA: Diagnosis not present

## 2014-09-21 DIAGNOSIS — H409 Unspecified glaucoma: Secondary | ICD-10-CM | POA: Diagnosis not present

## 2014-12-16 DIAGNOSIS — S81819A Laceration without foreign body, unspecified lower leg, initial encounter: Secondary | ICD-10-CM | POA: Diagnosis not present

## 2014-12-16 DIAGNOSIS — L03119 Cellulitis of unspecified part of limb: Secondary | ICD-10-CM | POA: Diagnosis not present

## 2015-01-26 DIAGNOSIS — Z23 Encounter for immunization: Secondary | ICD-10-CM | POA: Diagnosis not present

## 2015-01-26 DIAGNOSIS — R946 Abnormal results of thyroid function studies: Secondary | ICD-10-CM | POA: Diagnosis not present

## 2015-03-23 DIAGNOSIS — H16421 Pannus (corneal), right eye: Secondary | ICD-10-CM | POA: Diagnosis not present

## 2015-03-23 DIAGNOSIS — H02103 Unspecified ectropion of right eye, unspecified eyelid: Secondary | ICD-10-CM | POA: Diagnosis not present

## 2015-03-23 DIAGNOSIS — H401113 Primary open-angle glaucoma, right eye, severe stage: Secondary | ICD-10-CM | POA: Diagnosis not present

## 2015-03-23 DIAGNOSIS — H02053 Trichiasis without entropian right eye, unspecified eyelid: Secondary | ICD-10-CM | POA: Diagnosis not present

## 2015-05-08 DIAGNOSIS — H02532 Eyelid retraction right lower eyelid: Secondary | ICD-10-CM | POA: Diagnosis not present

## 2015-07-17 DIAGNOSIS — M40209 Unspecified kyphosis, site unspecified: Secondary | ICD-10-CM | POA: Diagnosis not present

## 2015-07-17 DIAGNOSIS — Z7189 Other specified counseling: Secondary | ICD-10-CM | POA: Diagnosis not present

## 2015-07-17 DIAGNOSIS — E785 Hyperlipidemia, unspecified: Secondary | ICD-10-CM | POA: Diagnosis not present

## 2015-07-17 DIAGNOSIS — C50911 Malignant neoplasm of unspecified site of right female breast: Secondary | ICD-10-CM | POA: Diagnosis not present

## 2015-07-17 DIAGNOSIS — M810 Age-related osteoporosis without current pathological fracture: Secondary | ICD-10-CM | POA: Diagnosis not present

## 2015-07-17 DIAGNOSIS — Z79899 Other long term (current) drug therapy: Secondary | ICD-10-CM | POA: Diagnosis not present

## 2015-07-17 DIAGNOSIS — E559 Vitamin D deficiency, unspecified: Secondary | ICD-10-CM | POA: Diagnosis not present

## 2015-07-17 DIAGNOSIS — R946 Abnormal results of thyroid function studies: Secondary | ICD-10-CM | POA: Diagnosis not present

## 2015-07-17 DIAGNOSIS — I10 Essential (primary) hypertension: Secondary | ICD-10-CM | POA: Diagnosis not present

## 2015-09-27 DIAGNOSIS — H401113 Primary open-angle glaucoma, right eye, severe stage: Secondary | ICD-10-CM | POA: Diagnosis not present

## 2015-12-14 ENCOUNTER — Other Ambulatory Visit: Payer: Self-pay

## 2016-01-09 DIAGNOSIS — M810 Age-related osteoporosis without current pathological fracture: Secondary | ICD-10-CM | POA: Diagnosis not present

## 2016-01-09 DIAGNOSIS — R946 Abnormal results of thyroid function studies: Secondary | ICD-10-CM | POA: Diagnosis not present

## 2016-01-09 DIAGNOSIS — Z79899 Other long term (current) drug therapy: Secondary | ICD-10-CM | POA: Diagnosis not present

## 2016-01-09 DIAGNOSIS — E785 Hyperlipidemia, unspecified: Secondary | ICD-10-CM | POA: Diagnosis not present

## 2016-01-09 DIAGNOSIS — Z23 Encounter for immunization: Secondary | ICD-10-CM | POA: Diagnosis not present

## 2016-01-09 DIAGNOSIS — M40209 Unspecified kyphosis, site unspecified: Secondary | ICD-10-CM | POA: Diagnosis not present

## 2016-01-09 DIAGNOSIS — C50911 Malignant neoplasm of unspecified site of right female breast: Secondary | ICD-10-CM | POA: Diagnosis not present

## 2016-01-09 DIAGNOSIS — I10 Essential (primary) hypertension: Secondary | ICD-10-CM | POA: Diagnosis not present

## 2016-04-04 DIAGNOSIS — H401113 Primary open-angle glaucoma, right eye, severe stage: Secondary | ICD-10-CM | POA: Diagnosis not present

## 2016-06-08 ENCOUNTER — Encounter (HOSPITAL_COMMUNITY): Payer: Self-pay

## 2016-06-08 ENCOUNTER — Emergency Department (HOSPITAL_COMMUNITY)
Admission: EM | Admit: 2016-06-08 | Discharge: 2016-06-08 | Disposition: A | Discharge status: Home / Self Care | Payer: Medicare Other | Attending: Emergency Medicine | Admitting: Emergency Medicine

## 2016-06-08 ENCOUNTER — Emergency Department (HOSPITAL_COMMUNITY): Payer: Medicare Other

## 2016-06-08 DIAGNOSIS — S3993XA Unspecified injury of pelvis, initial encounter: Secondary | ICD-10-CM | POA: Diagnosis not present

## 2016-06-08 DIAGNOSIS — M25512 Pain in left shoulder: Secondary | ICD-10-CM | POA: Insufficient documentation

## 2016-06-08 DIAGNOSIS — M542 Cervicalgia: Secondary | ICD-10-CM | POA: Insufficient documentation

## 2016-06-08 DIAGNOSIS — Y999 Unspecified external cause status: Secondary | ICD-10-CM | POA: Diagnosis not present

## 2016-06-08 DIAGNOSIS — T148XXA Other injury of unspecified body region, initial encounter: Secondary | ICD-10-CM | POA: Diagnosis not present

## 2016-06-08 DIAGNOSIS — Y939 Activity, unspecified: Secondary | ICD-10-CM | POA: Insufficient documentation

## 2016-06-08 DIAGNOSIS — R102 Pelvic and perineal pain: Secondary | ICD-10-CM | POA: Insufficient documentation

## 2016-06-08 DIAGNOSIS — S299XXA Unspecified injury of thorax, initial encounter: Secondary | ICD-10-CM | POA: Diagnosis not present

## 2016-06-08 DIAGNOSIS — R930 Abnormal findings on diagnostic imaging of skull and head, not elsewhere classified: Secondary | ICD-10-CM | POA: Insufficient documentation

## 2016-06-08 DIAGNOSIS — F0390 Unspecified dementia without behavioral disturbance: Secondary | ICD-10-CM | POA: Diagnosis not present

## 2016-06-08 DIAGNOSIS — W1839XA Other fall on same level, initial encounter: Secondary | ICD-10-CM | POA: Diagnosis not present

## 2016-06-08 DIAGNOSIS — W19XXXA Unspecified fall, initial encounter: Secondary | ICD-10-CM

## 2016-06-08 DIAGNOSIS — Y92009 Unspecified place in unspecified non-institutional (private) residence as the place of occurrence of the external cause: Secondary | ICD-10-CM | POA: Insufficient documentation

## 2016-06-08 DIAGNOSIS — S0990XA Unspecified injury of head, initial encounter: Secondary | ICD-10-CM | POA: Diagnosis not present

## 2016-06-08 LAB — URINALYSIS, ROUTINE W REFLEX MICROSCOPIC
Bilirubin Urine: NEGATIVE
GLUCOSE, UA: NEGATIVE mg/dL
HGB URINE DIPSTICK: NEGATIVE
KETONES UR: NEGATIVE mg/dL
LEUKOCYTES UA: NEGATIVE
Nitrite: NEGATIVE
PROTEIN: NEGATIVE mg/dL
Specific Gravity, Urine: 1.013 (ref 1.005–1.030)
pH: 7 (ref 5.0–8.0)

## 2016-06-08 LAB — I-STAT TROPONIN, ED: Troponin i, poc: 0 ng/mL (ref 0.00–0.08)

## 2016-06-08 LAB — CBC WITH DIFFERENTIAL/PLATELET
BASOS ABS: 0 10*3/uL (ref 0.0–0.1)
BASOS PCT: 0 %
Eosinophils Absolute: 0.1 10*3/uL (ref 0.0–0.7)
Eosinophils Relative: 1 %
HCT: 38.8 % (ref 36.0–46.0)
Hemoglobin: 12.8 g/dL (ref 12.0–15.0)
Lymphocytes Relative: 17 %
Lymphs Abs: 2 10*3/uL (ref 0.7–4.0)
MCH: 30.1 pg (ref 26.0–34.0)
MCHC: 33 g/dL (ref 30.0–36.0)
MCV: 91.3 fL (ref 78.0–100.0)
Monocytes Absolute: 1.2 10*3/uL — ABNORMAL HIGH (ref 0.1–1.0)
Monocytes Relative: 10 %
NEUTROS ABS: 8.7 10*3/uL — AB (ref 1.7–7.7)
Neutrophils Relative %: 72 %
Platelets: 217 10*3/uL (ref 150–400)
RBC: 4.25 MIL/uL (ref 3.87–5.11)
RDW: 14.4 % (ref 11.5–15.5)
WBC: 12 10*3/uL — ABNORMAL HIGH (ref 4.0–10.5)

## 2016-06-08 LAB — COMPREHENSIVE METABOLIC PANEL
ALBUMIN: 3.8 g/dL (ref 3.5–5.0)
ALK PHOS: 45 U/L (ref 38–126)
ALT: 22 U/L (ref 14–54)
AST: 33 U/L (ref 15–41)
Anion gap: 9 (ref 5–15)
BILIRUBIN TOTAL: 0.7 mg/dL (ref 0.3–1.2)
BUN: 27 mg/dL — AB (ref 6–20)
CALCIUM: 9.6 mg/dL (ref 8.9–10.3)
CO2: 29 mmol/L (ref 22–32)
CREATININE: 0.79 mg/dL (ref 0.44–1.00)
Chloride: 103 mmol/L (ref 101–111)
GFR calc Af Amer: >60 mL/min (ref >60)
GFR calc non Af Amer: >60 mL/min (ref >60)
GLUCOSE: 102 mg/dL — AB (ref 65–99)
POTASSIUM: 3.4 mmol/L — AB (ref 3.5–5.1)
Sodium: 141 mmol/L (ref 135–145)
TOTAL PROTEIN: 6.5 g/dL (ref 6.5–8.1)

## 2016-06-08 MED ORDER — ACETAMINOPHEN 325 MG PO TABS
650.0000 mg | ORAL_TABLET | Freq: Once | ORAL | Status: AC
  Administered 2016-06-08: 650 mg via ORAL
  Filled 2016-06-08: qty 2

## 2016-06-08 MED ORDER — FENTANYL CITRATE (PF) 100 MCG/2ML IJ SOLN: 25.0000 ug | Freq: Once | INTRAMUSCULAR | Status: DC

## 2016-06-08 MED ORDER — ACETAMINOPHEN 325 MG PO TABS: 650.0000 mg | ORAL_TABLET | Freq: Once | ORAL | Status: DC

## 2016-06-08 MED ORDER — SODIUM CHLORIDE 0.9 % IV BOLUS (SEPSIS)
500.0000 mL | Freq: Once | INTRAVENOUS | Status: AC
  Administered 2016-06-08: 500 mL via INTRAVENOUS

## 2016-06-08 NOTE — ED Notes (Signed)
Patient transported to CT 

## 2016-06-08 NOTE — ED Notes (Signed)
Pt lives a lone at home. Sister check on her once a day.

## 2016-06-08 NOTE — ED Notes (Signed)
Fentanyl not given per family request. Family need something order otc.

## 2016-06-08 NOTE — Progress Notes (Signed)
CSW spoke with patient and patient's family at bedside. CSW inquired about patient's recent fall and any support/resources needed. Patient's family reported that they are awaiting to hear back from the MD and that they are not in need of any resources at the time. CSW informed patient and patient's family that if any needs arise they can notify patient's RN.

## 2016-06-08 NOTE — ED Provider Notes (Signed)
[facility] DEPT Provider Note   CSN: [id number] Arrival date & time: 06/08/16  1453     History   Chief Complaint Chief Complaint  Patient presents with  . Fall    HPI Rhonda Owen is a 83 y.o. female history of hypertension, osteoporosis, here presenting with fall. Patient still lives at home with her sister. Patient was apparently outside and was found on the floor. Patient demented at baseline and can't give me any history. Came by EMS, who reported normal fingerstick as well as normal vitals. Patient is not on blood thinners as per EMS.   The history is provided by the patient.   Level V caveat- dementia   Past Medical History:  Diagnosis Date  . Arthritis   . Cervical (neck) region somatic dysfunction    pt is unable to lift head  . 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 Hearing difficulty of both ears   . History of kidney stones   . Hypertension   . Osteoporosis   . Stress fracture of femoral neck 2005    Patient Active Problem List   Diagnosis Date Noted  . Breast cancer of upper-outer quadrant of right female breast ([place]) 08/27/2013    Past Surgical History:  Procedure Laterality Date  . BREAST LUMPECTOMY WITH NEEDLE LOCALIZATION Right 01/07/2014   Procedure: RIGHT BREAST LUMPECTOMY WITH NEEDLE LOCALIZATION;  Surgeon: Alphonsa Overall, MD;  Location: [facility];  Service: General;  Laterality: Right;  . CHOLECYSTECTOMY    . ENUCLEATION Left 2005  . EYE SURGERY    . kidney stones  1989  . RETINAL DETACHMENT SURGERY Left     OB History    No data available       Home Medications    Prior to Admission medications   Medication Sig Start Date End Date Taking? Authorizing Provider  amLODipine (NORVASC) 5 MG tablet Take 5 mg by mouth daily.    Historical Provider, MD  aspirin 81 MG tablet Take 81 mg by mouth daily.    Historical Provider, MD  calcium-vitamin D (OSCAL WITH D) 500-200 MG-UNIT per tablet Take 1 tablet by mouth.    Historical Provider,  MD  furosemide (LASIX) 20 MG tablet Take 20 mg by mouth.    Historical Provider, MD  losartan (COZAAR) 50 MG tablet Take 50 mg by mouth daily.    Historical Provider, MD  Multiple Vitamin (MULTIVITAMIN) tablet Take 1 tablet by mouth daily.    Historical Provider, MD  raloxifene (EVISTA) 60 MG tablet Take 60 mg by mouth daily.    Historical Provider, MD  Travoprost, BAK Free, (TRAVATAN) 0.004 % SOLN ophthalmic solution 1 drop at bedtime.    Historical Provider, MD    Family History Family History  Problem Relation Age of Onset  . Heart disease Mother   . Heart disease Father     Social History Social History  Substance Use Topics  . Smoking status: Never Smoker  . Smokeless tobacco: Not on file  . Alcohol use No     Allergies   Patient has no known allergies.   Review of Systems Review of Systems  All other systems reviewed and are negative.    Physical Exam Updated Vital Signs BP 115/65   Pulse 80   Temp 97.8 F (36.6 C) (Oral)   Resp 15   SpO2 98%   Physical Exam  Constitutional:  Demented, NAD   HENT:  Head: Normocephalic.  No obvious scalp hematoma  Eyes: EOM are normal. Pupils are equal, round, and reactive to light.  Neck:  C collar in place   Cardiovascular: Normal rate, regular rhythm and normal heart sounds.   Pulmonary/Chest: Effort normal and breath sounds normal. No respiratory distress. She has no wheezes.  Abdominal: Soft. Bowel sounds are normal.  Musculoskeletal:  Pelvis stable, no obvious hip deformity. No obvious spinal tenderness or step off. No obvious extremity trauma   Neurological: She is alert.  Demented, moving all extremities   Skin: Skin is warm.  Psychiatric:  Unable   Nursing note and vitals reviewed.    ED Treatments / Results  Labs (all labs ordered are listed, but only abnormal results are displayed) Labs Reviewed  CBC WITH DIFFERENTIAL/PLATELET - Abnormal; Notable for the following:       Result Value   WBC 12.0  (*)    Neutro Abs 8.7 (*)    Monocytes Absolute 1.2 (*)    All other components within normal limits  COMPREHENSIVE METABOLIC PANEL - Abnormal; Notable for the following:    Potassium 3.4 (*)    Glucose, Bld 102 (*)    BUN 27 (*)    All other components within normal limits  URINALYSIS, ROUTINE W REFLEX MICROSCOPIC - Abnormal; Notable for the following:    APPearance HAZY (*)    All other components within normal limits  URINE CULTURE  I-STAT TROPOININ, ED    EKG  EKG Interpretation  Date/Time:  Saturday June 08 2016 15:15:20 EST Ventricular Rate:  81 PR Interval:    QRS Duration: 104 QT Interval:  414 QTC Calculation: 481 R Axis:   -16 Text Interpretation:  Sinus rhythm Prolonged PR interval Borderline left axis deviation Anteroseptal infarct, old No significant change since last tracing Confirmed by YAO  MD, DAVID ([id number]) on 06/08/2016 3:18:49 PM       Radiology Dg Chest 1 View  Result Date: 06/08/2016 CLINICAL DATA:  Fall today while on porch.  Initial encounter. EXAM: CHEST 1 VIEW COMPARISON:  12/31/2013 FINDINGS: The patient is mildly rotated to the left, less than on the prior study. Allowing for differences in rotation, the cardiomediastinal silhouette is unchanged, in the heart size is within normal limits. Aortic atherosclerosis is noted. There is biapical lung scarring. No acute airspace consolidation, edema, sizable pleural effusion, or pneumothorax is identified. The bones are osteopenic. There is mild thoracic dextroscoliosis. No acute osseous abnormality is identified. IMPRESSION: 1. No evidence of acute cardiopulmonary disease. 2. Aortic atherosclerosis. Electronically Signed   By: Logan Bores M.D.   On: 06/08/2016 16:17   Dg Pelvis 1-2 Views  Result Date: 06/08/2016 CLINICAL DATA:  Fall. EXAM: PELVIS - 1-2 VIEW COMPARISON:  None. FINDINGS: There is no evidence of pelvic fracture or diastasis. Severe degenerative joint disease of right hip is noted with possible  avascular necrosis of right femoral head. Mild degenerative joint disease of the left hip is noted. IMPRESSION: Severe degenerative joint disease of the right hip with possible avascular necrosis of right femoral head. No acute fracture or dislocation is noted. Electronically Signed   By: Marijo Conception, M.D.   On: 06/08/2016 16:19   Ct Head Wo Contrast  Result Date: 06/08/2016 CLINICAL DATA:  Fall with neck and shoulder pain EXAM: CT HEAD WITHOUT CONTRAST CT CERVICAL SPINE WITHOUT CONTRAST TECHNIQUE: Multidetector CT imaging of the head and cervical spine was performed following the standard protocol without intravenous contrast. Multiplanar CT image reconstructions of the cervical spine were also generated. COMPARISON:  None. FINDINGS: CT HEAD FINDINGS Brain: No subdural, epidural, or subarachnoid hemorrhage. The falx is partially calcified. Ventricles and sulci are prominent, likely due to age related atrophy. No intraventricular blood is identified. No mass, mass effect, or midline shift. Moderate white matter changes are identified. No evidence of acute cortical ischemia or infarct. The cerebellum, brainstem, and basal cisterns are within normal limits. Vascular: Calcified atherosclerosis is seen in the intracranial carotid arteries. Skull: Normal. Negative for fracture or focal lesion. Sinuses/Orbits: No acute finding. Other: Postprocedural changes are seen in the left globe. CT CERVICAL SPINE FINDINGS Alignment: Minimal anterolisthesis of C4 versus C5 is likely degenerative. No other significant malalignment. Skull base and vertebrae: No fractures are identified. Soft tissues and spinal canal: No prevertebral fluid or swelling. No visible canal hematoma. Disc levels: Multilevel degenerative changes with anterior and posterior osteophytes. Upper chest: There is opacification in the upper right apex, likely scarring with no chest wall or bony invasion. This is asymmetric to the left. The lung apices are  otherwise normal. Other: No other abnormalities. IMPRESSION: 1. No acute intracranial abnormalities. 2. No fracture or traumatic malalignment in the cervical spine. Severe multilevel degenerative changes. 3. There is opacification in the right lung apex asymmetric to the left. This is favored to represent scarring but is nonspecific. Recommend follow-up as clinically warranted. Electronically Signed   By: Dorise Bullion III M.D   On: 06/08/2016 16:30   Ct Cervical Spine Wo Contrast  Result Date: 06/08/2016 CLINICAL DATA:  Fall with neck and shoulder pain EXAM: CT HEAD WITHOUT CONTRAST CT CERVICAL SPINE WITHOUT CONTRAST TECHNIQUE: Multidetector CT imaging of the head and cervical spine was performed following the standard protocol without intravenous contrast. Multiplanar CT image reconstructions of the cervical spine were also generated. COMPARISON:  None. FINDINGS: CT HEAD FINDINGS Brain: No subdural, epidural, or subarachnoid hemorrhage. The falx is partially calcified. Ventricles and sulci are prominent, likely due to age related atrophy. No intraventricular blood is identified. No mass, mass effect, or midline shift. Moderate white matter changes are identified. No evidence of acute cortical ischemia or infarct. The cerebellum, brainstem, and basal cisterns are within normal limits. Vascular: Calcified atherosclerosis is seen in the intracranial carotid arteries. Skull: Normal. Negative for fracture or focal lesion. Sinuses/Orbits: No acute finding. Other: Postprocedural changes are seen in the left globe. CT CERVICAL SPINE FINDINGS Alignment: Minimal anterolisthesis of C4 versus C5 is likely degenerative. No other significant malalignment. Skull base and vertebrae: No fractures are identified. Soft tissues and spinal canal: No prevertebral fluid or swelling. No visible canal hematoma. Disc levels: Multilevel degenerative changes with anterior and posterior osteophytes. Upper chest: There is opacification in  the upper right apex, likely scarring with no chest wall or bony invasion. This is asymmetric to the left. The lung apices are otherwise normal. Other: No other abnormalities. IMPRESSION: 1. No acute intracranial abnormalities. 2. No fracture or traumatic malalignment in the cervical spine. Severe multilevel degenerative changes. 3. There is opacification in the right lung apex asymmetric to the left. This is favored to represent scarring but is nonspecific. Recommend follow-up as clinically warranted. Electronically Signed   By: Dorise Bullion III M.D   On: 06/08/2016 16:30   Ct Pelvis Wo Contrast  Result Date: 06/08/2016 CLINICAL DATA:  Pelvic pain status post fall. EXAM: CT PELVIS WITHOUT CONTRAST TECHNIQUE: Multidetector CT imaging of the pelvis was performed following the standard protocol without intravenous contrast. COMPARISON:  None. FINDINGS: Severe degenerative osteoarthritis at the right hip joint, with  complete loss of joint space, extensive degenerative subchondral cyst formation and degenerative osseous spurring, and associated acetabular protrusio. Milder degenerative change at the left hip joint. No osseous fracture line or displaced fracture fragment identified. Extensive diverticulosis within the sigmoid colon but no inflammatory change to suggest acute diverticulitis. Pelvic portion of the bowel is normal in caliber. Atherosclerotic changes noted within the lower abdominal aorta and pelvic vasculature. No free fluid or hemorrhage within the pelvis. Superficial soft tissues about the pelvis are unremarkable. IMPRESSION: 1. No acute findings.  No osseous fracture or dislocation. 2. Severe degenerative change at the right hip joint, as detailed above. 3. Sigmoid colon diverticulosis without evidence of acute diverticulitis. 4. Aortic atherosclerosis. Electronically Signed   By: Franki Cabot M.D.   On: 06/08/2016 17:44    Procedures Procedures (including critical care time)  Medications  Ordered in ED Medications  fentaNYL (SUBLIMAZE) injection 25 mcg (25 mcg Intravenous Refused 06/08/16 1758)  sodium chloride 0.9 % bolus 500 mL (0 mLs Intravenous Stopped 06/08/16 1733)  acetaminophen (TYLENOL) tablet 650 mg (650 mg Oral Given 06/08/16 1802)     Initial Impression / Assessment and Plan / ED Course  I have reviewed the triage vital signs and the nursing notes.  Pertinent labs & imaging results that were available during my care of the patient were reviewed by me and considered in my medical decision making (see chart for details).     Rhonda Owen is a 83 y.o. female here with fall. Unwitnessed fall at home. Will get labs, CT head/neck, xrays.   6:28 PM Labs showed BUN 27, Cr 0.79. UA nl. CT showed no fractures. Able to stand up and walk with assistance and able to use the commode. Family at bedside and will have her home. She lives at home but her sister checks on her frequently. She will stay overnight with family.     Final Clinical Impressions(s) / ED Diagnoses   Final diagnoses:  None    New Prescriptions New Prescriptions   No medications on file     Drenda Freeze, MD 06/08/16 [phone]

## 2016-06-08 NOTE — ED Triage Notes (Signed)
Patient came from home by EMS after having a fall. Pt is not on blood thinners. Pt c/o neck and left shoulder pain. Pt on c-collar at arrival.

## 2016-06-08 NOTE — Discharge Instructions (Signed)
Take tylenol 500 mg every 6 hrs or motrin 400 mg every 6 hrs for pain.   See your doctor  Return to ER if you have severe pain, unable to walk, chest pain, weakness

## 2016-06-08 NOTE — ED Notes (Signed)
Bed: [id number] Expected date:  Expected time:  Means of arrival:  Comments: 83 yo f fall, hit head, no loss of consciousness

## 2016-06-10 LAB — URINE CULTURE

## 2016-06-11 ENCOUNTER — Telehealth: Payer: Self-pay | Admitting: Emergency Medicine

## 2016-06-11 NOTE — Telephone Encounter (Signed)
Post ED Visit - Positive Culture Follow-up: Successful Patient Follow-Up  Culture assessed and recommendations reviewed by: []  Elenor Quinones, Pharm.D. [x]  Heide Guile, Pharm.D., BCPS []  Parks Neptune, Pharm.D. []  Alycia Rossetti, Pharm.D., BCPS []  [place], Pharm.D., BCPS, AAHIVP []  Legrand Como, Pharm.D., BCPS, AAHIVP []  Cassie Stewart, Pharm.D. []  Stephens November, Pharm.D.  Positive urine culture  [x]  Patient discharged without antimicrobial prescription and treatment is now indicated []  Organism is resistant to prescribed ED discharge antimicrobial []  Patient with positive blood cultures  Changes discussed with ED provider: Shary Decamp PA  New antibiotic prescription Fosfomycin 3 grams packet x                                            1 dose Called to [facility]   Contacted sister Florence Canner 06/11/2016 [place], Iasha Mccalister 06/11/2016, 1:39 PM

## 2016-06-11 NOTE — Progress Notes (Signed)
ED Antimicrobial Stewardship Positive Culture Follow Up   Rhonda Owen is an 83 y.o. female who presented to [facility] on 06/08/2016 with a chief complaint of   Chief Complaint  Patient presents with  . Fall    Recent Results (from the past 720 hour(s))  Urine culture     Status: Abnormal   Collection Time: 06/08/16  5:01 PM  Result Value Ref Range Status   Specimen Description URINE, RANDOM  Final   Special Requests NONE  Final   Culture >=100,000 COLONIES/mL ESCHERICHIA COLI (A)  Final   Report Status 06/10/2016 FINAL  Final   Organism ID, Bacteria ESCHERICHIA COLI (A)  Final      Susceptibility   Escherichia coli - MIC*    AMPICILLIN 8 SENSITIVE Sensitive     CEFAZOLIN <=4 SENSITIVE Sensitive     CEFTRIAXONE <=1 SENSITIVE Sensitive     CIPROFLOXACIN <=0.25 SENSITIVE Sensitive     GENTAMICIN <=1 SENSITIVE Sensitive     IMIPENEM <=0.25 SENSITIVE Sensitive     NITROFURANTOIN <=16 SENSITIVE Sensitive     TRIMETH/SULFA <=20 SENSITIVE Sensitive     AMPICILLIN/SULBACTAM 4 SENSITIVE Sensitive     PIP/TAZO <=4 SENSITIVE Sensitive     Extended ESBL NEGATIVE Sensitive     * >=100,000 COLONIES/mL ESCHERICHIA COLI    [x]  Patient discharged originally without antimicrobial agent and treatment is now indicated  New antibiotic prescription: Fosfomycin 3 gram packet x 1 dose   ED Provider: Shary Decamp PA-C   Duayne Cal 06/11/2016, 10:42 AM Infectious Diseases Pharmacist Phone# [phone]

## 2016-06-18 DIAGNOSIS — M40209 Unspecified kyphosis, site unspecified: Secondary | ICD-10-CM | POA: Diagnosis not present

## 2016-06-18 DIAGNOSIS — L89329 Pressure ulcer of left buttock, unspecified stage: Secondary | ICD-10-CM | POA: Diagnosis not present

## 2016-06-18 DIAGNOSIS — E785 Hyperlipidemia, unspecified: Secondary | ICD-10-CM | POA: Diagnosis not present

## 2016-06-18 DIAGNOSIS — M810 Age-related osteoporosis without current pathological fracture: Secondary | ICD-10-CM | POA: Diagnosis not present

## 2016-06-18 DIAGNOSIS — I10 Essential (primary) hypertension: Secondary | ICD-10-CM | POA: Diagnosis not present

## 2016-06-18 DIAGNOSIS — L8990 Pressure ulcer of unspecified site, unspecified stage: Secondary | ICD-10-CM | POA: Diagnosis not present

## 2016-06-18 DIAGNOSIS — R946 Abnormal results of thyroid function studies: Secondary | ICD-10-CM | POA: Diagnosis not present

## 2016-06-18 DIAGNOSIS — Z79899 Other long term (current) drug therapy: Secondary | ICD-10-CM | POA: Diagnosis not present

## 2016-06-23 DIAGNOSIS — E785 Hyperlipidemia, unspecified: Secondary | ICD-10-CM | POA: Diagnosis not present

## 2016-06-23 DIAGNOSIS — M810 Age-related osteoporosis without current pathological fracture: Secondary | ICD-10-CM | POA: Diagnosis not present

## 2016-06-23 DIAGNOSIS — I10 Essential (primary) hypertension: Secondary | ICD-10-CM | POA: Diagnosis not present

## 2016-06-23 DIAGNOSIS — L89322 Pressure ulcer of left buttock, stage 2: Secondary | ICD-10-CM | POA: Diagnosis not present

## 2016-06-23 DIAGNOSIS — Z853 Personal history of malignant neoplasm of breast: Secondary | ICD-10-CM | POA: Diagnosis not present

## 2016-06-23 DIAGNOSIS — L89312 Pressure ulcer of right buttock, stage 2: Secondary | ICD-10-CM | POA: Diagnosis not present

## 2016-06-24 DIAGNOSIS — L89312 Pressure ulcer of right buttock, stage 2: Secondary | ICD-10-CM | POA: Diagnosis not present

## 2016-06-24 DIAGNOSIS — M810 Age-related osteoporosis without current pathological fracture: Secondary | ICD-10-CM | POA: Diagnosis not present

## 2016-06-24 DIAGNOSIS — L89322 Pressure ulcer of left buttock, stage 2: Secondary | ICD-10-CM | POA: Diagnosis not present

## 2016-06-24 DIAGNOSIS — E785 Hyperlipidemia, unspecified: Secondary | ICD-10-CM | POA: Diagnosis not present

## 2016-06-24 DIAGNOSIS — I10 Essential (primary) hypertension: Secondary | ICD-10-CM | POA: Diagnosis not present

## 2016-06-24 DIAGNOSIS — Z853 Personal history of malignant neoplasm of breast: Secondary | ICD-10-CM | POA: Diagnosis not present

## 2016-06-27 DIAGNOSIS — L89312 Pressure ulcer of right buttock, stage 2: Secondary | ICD-10-CM | POA: Diagnosis not present

## 2016-06-27 DIAGNOSIS — E785 Hyperlipidemia, unspecified: Secondary | ICD-10-CM | POA: Diagnosis not present

## 2016-06-27 DIAGNOSIS — I10 Essential (primary) hypertension: Secondary | ICD-10-CM | POA: Diagnosis not present

## 2016-06-27 DIAGNOSIS — M810 Age-related osteoporosis without current pathological fracture: Secondary | ICD-10-CM | POA: Diagnosis not present

## 2016-06-27 DIAGNOSIS — Z853 Personal history of malignant neoplasm of breast: Secondary | ICD-10-CM | POA: Diagnosis not present

## 2016-06-27 DIAGNOSIS — L89322 Pressure ulcer of left buttock, stage 2: Secondary | ICD-10-CM | POA: Diagnosis not present

## 2016-07-01 DIAGNOSIS — E785 Hyperlipidemia, unspecified: Secondary | ICD-10-CM | POA: Diagnosis not present

## 2016-07-01 DIAGNOSIS — Z853 Personal history of malignant neoplasm of breast: Secondary | ICD-10-CM | POA: Diagnosis not present

## 2016-07-01 DIAGNOSIS — L89312 Pressure ulcer of right buttock, stage 2: Secondary | ICD-10-CM | POA: Diagnosis not present

## 2016-07-01 DIAGNOSIS — M810 Age-related osteoporosis without current pathological fracture: Secondary | ICD-10-CM | POA: Diagnosis not present

## 2016-07-01 DIAGNOSIS — L89322 Pressure ulcer of left buttock, stage 2: Secondary | ICD-10-CM | POA: Diagnosis not present

## 2016-07-01 DIAGNOSIS — I10 Essential (primary) hypertension: Secondary | ICD-10-CM | POA: Diagnosis not present

## 2016-07-03 DIAGNOSIS — Z853 Personal history of malignant neoplasm of breast: Secondary | ICD-10-CM | POA: Diagnosis not present

## 2016-07-03 DIAGNOSIS — L89322 Pressure ulcer of left buttock, stage 2: Secondary | ICD-10-CM | POA: Diagnosis not present

## 2016-07-03 DIAGNOSIS — M810 Age-related osteoporosis without current pathological fracture: Secondary | ICD-10-CM | POA: Diagnosis not present

## 2016-07-03 DIAGNOSIS — E785 Hyperlipidemia, unspecified: Secondary | ICD-10-CM | POA: Diagnosis not present

## 2016-07-03 DIAGNOSIS — I10 Essential (primary) hypertension: Secondary | ICD-10-CM | POA: Diagnosis not present

## 2016-07-03 DIAGNOSIS — L89312 Pressure ulcer of right buttock, stage 2: Secondary | ICD-10-CM | POA: Diagnosis not present

## 2016-07-08 DIAGNOSIS — M810 Age-related osteoporosis without current pathological fracture: Secondary | ICD-10-CM | POA: Diagnosis not present

## 2016-07-08 DIAGNOSIS — L89322 Pressure ulcer of left buttock, stage 2: Secondary | ICD-10-CM | POA: Diagnosis not present

## 2016-07-08 DIAGNOSIS — E785 Hyperlipidemia, unspecified: Secondary | ICD-10-CM | POA: Diagnosis not present

## 2016-07-08 DIAGNOSIS — Z853 Personal history of malignant neoplasm of breast: Secondary | ICD-10-CM | POA: Diagnosis not present

## 2016-07-08 DIAGNOSIS — I10 Essential (primary) hypertension: Secondary | ICD-10-CM | POA: Diagnosis not present

## 2016-07-08 DIAGNOSIS — L89312 Pressure ulcer of right buttock, stage 2: Secondary | ICD-10-CM | POA: Diagnosis not present

## 2016-07-10 DIAGNOSIS — Z853 Personal history of malignant neoplasm of breast: Secondary | ICD-10-CM | POA: Diagnosis not present

## 2016-07-10 DIAGNOSIS — M810 Age-related osteoporosis without current pathological fracture: Secondary | ICD-10-CM | POA: Diagnosis not present

## 2016-07-10 DIAGNOSIS — L89312 Pressure ulcer of right buttock, stage 2: Secondary | ICD-10-CM | POA: Diagnosis not present

## 2016-07-10 DIAGNOSIS — I10 Essential (primary) hypertension: Secondary | ICD-10-CM | POA: Diagnosis not present

## 2016-07-10 DIAGNOSIS — E785 Hyperlipidemia, unspecified: Secondary | ICD-10-CM | POA: Diagnosis not present

## 2016-07-10 DIAGNOSIS — L89322 Pressure ulcer of left buttock, stage 2: Secondary | ICD-10-CM | POA: Diagnosis not present

## 2016-07-15 DIAGNOSIS — Z853 Personal history of malignant neoplasm of breast: Secondary | ICD-10-CM | POA: Diagnosis not present

## 2016-07-15 DIAGNOSIS — L89322 Pressure ulcer of left buttock, stage 2: Secondary | ICD-10-CM | POA: Diagnosis not present

## 2016-07-15 DIAGNOSIS — M810 Age-related osteoporosis without current pathological fracture: Secondary | ICD-10-CM | POA: Diagnosis not present

## 2016-07-15 DIAGNOSIS — I10 Essential (primary) hypertension: Secondary | ICD-10-CM | POA: Diagnosis not present

## 2016-07-15 DIAGNOSIS — L89312 Pressure ulcer of right buttock, stage 2: Secondary | ICD-10-CM | POA: Diagnosis not present

## 2016-07-15 DIAGNOSIS — E785 Hyperlipidemia, unspecified: Secondary | ICD-10-CM | POA: Diagnosis not present

## 2016-07-16 DIAGNOSIS — Z853 Personal history of malignant neoplasm of breast: Secondary | ICD-10-CM | POA: Diagnosis not present

## 2016-07-16 DIAGNOSIS — L89322 Pressure ulcer of left buttock, stage 2: Secondary | ICD-10-CM | POA: Diagnosis not present

## 2016-07-16 DIAGNOSIS — E785 Hyperlipidemia, unspecified: Secondary | ICD-10-CM | POA: Diagnosis not present

## 2016-07-16 DIAGNOSIS — L89312 Pressure ulcer of right buttock, stage 2: Secondary | ICD-10-CM | POA: Diagnosis not present

## 2016-07-16 DIAGNOSIS — I10 Essential (primary) hypertension: Secondary | ICD-10-CM | POA: Diagnosis not present

## 2016-07-16 DIAGNOSIS — M810 Age-related osteoporosis without current pathological fracture: Secondary | ICD-10-CM | POA: Diagnosis not present

## 2016-07-22 DIAGNOSIS — L89312 Pressure ulcer of right buttock, stage 2: Secondary | ICD-10-CM | POA: Diagnosis not present

## 2016-07-22 DIAGNOSIS — M810 Age-related osteoporosis without current pathological fracture: Secondary | ICD-10-CM | POA: Diagnosis not present

## 2016-07-22 DIAGNOSIS — L89322 Pressure ulcer of left buttock, stage 2: Secondary | ICD-10-CM | POA: Diagnosis not present

## 2016-07-22 DIAGNOSIS — Z853 Personal history of malignant neoplasm of breast: Secondary | ICD-10-CM | POA: Diagnosis not present

## 2016-07-22 DIAGNOSIS — I10 Essential (primary) hypertension: Secondary | ICD-10-CM | POA: Diagnosis not present

## 2016-07-22 DIAGNOSIS — E785 Hyperlipidemia, unspecified: Secondary | ICD-10-CM | POA: Diagnosis not present

## 2016-07-25 DIAGNOSIS — Z853 Personal history of malignant neoplasm of breast: Secondary | ICD-10-CM | POA: Diagnosis not present

## 2016-07-25 DIAGNOSIS — L89312 Pressure ulcer of right buttock, stage 2: Secondary | ICD-10-CM | POA: Diagnosis not present

## 2016-07-25 DIAGNOSIS — I10 Essential (primary) hypertension: Secondary | ICD-10-CM | POA: Diagnosis not present

## 2016-07-25 DIAGNOSIS — M810 Age-related osteoporosis without current pathological fracture: Secondary | ICD-10-CM | POA: Diagnosis not present

## 2016-07-25 DIAGNOSIS — E785 Hyperlipidemia, unspecified: Secondary | ICD-10-CM | POA: Diagnosis not present

## 2016-07-25 DIAGNOSIS — L89322 Pressure ulcer of left buttock, stage 2: Secondary | ICD-10-CM | POA: Diagnosis not present

## 2016-07-29 DIAGNOSIS — E785 Hyperlipidemia, unspecified: Secondary | ICD-10-CM | POA: Diagnosis not present

## 2016-07-29 DIAGNOSIS — L89312 Pressure ulcer of right buttock, stage 2: Secondary | ICD-10-CM | POA: Diagnosis not present

## 2016-07-29 DIAGNOSIS — Z853 Personal history of malignant neoplasm of breast: Secondary | ICD-10-CM | POA: Diagnosis not present

## 2016-07-29 DIAGNOSIS — L89322 Pressure ulcer of left buttock, stage 2: Secondary | ICD-10-CM | POA: Diagnosis not present

## 2016-07-29 DIAGNOSIS — I10 Essential (primary) hypertension: Secondary | ICD-10-CM | POA: Diagnosis not present

## 2016-07-29 DIAGNOSIS — M810 Age-related osteoporosis without current pathological fracture: Secondary | ICD-10-CM | POA: Diagnosis not present

## 2016-08-01 DIAGNOSIS — E785 Hyperlipidemia, unspecified: Secondary | ICD-10-CM | POA: Diagnosis not present

## 2016-08-01 DIAGNOSIS — I10 Essential (primary) hypertension: Secondary | ICD-10-CM | POA: Diagnosis not present

## 2016-08-01 DIAGNOSIS — L89322 Pressure ulcer of left buttock, stage 2: Secondary | ICD-10-CM | POA: Diagnosis not present

## 2016-08-01 DIAGNOSIS — L89312 Pressure ulcer of right buttock, stage 2: Secondary | ICD-10-CM | POA: Diagnosis not present

## 2016-08-01 DIAGNOSIS — Z853 Personal history of malignant neoplasm of breast: Secondary | ICD-10-CM | POA: Diagnosis not present

## 2016-08-01 DIAGNOSIS — M810 Age-related osteoporosis without current pathological fracture: Secondary | ICD-10-CM | POA: Diagnosis not present

## 2016-08-05 DIAGNOSIS — E785 Hyperlipidemia, unspecified: Secondary | ICD-10-CM | POA: Diagnosis not present

## 2016-08-05 DIAGNOSIS — M810 Age-related osteoporosis without current pathological fracture: Secondary | ICD-10-CM | POA: Diagnosis not present

## 2016-08-05 DIAGNOSIS — L89322 Pressure ulcer of left buttock, stage 2: Secondary | ICD-10-CM | POA: Diagnosis not present

## 2016-08-05 DIAGNOSIS — L89312 Pressure ulcer of right buttock, stage 2: Secondary | ICD-10-CM | POA: Diagnosis not present

## 2016-08-05 DIAGNOSIS — I10 Essential (primary) hypertension: Secondary | ICD-10-CM | POA: Diagnosis not present

## 2016-08-05 DIAGNOSIS — Z853 Personal history of malignant neoplasm of breast: Secondary | ICD-10-CM | POA: Diagnosis not present

## 2016-08-06 ENCOUNTER — Emergency Department (HOSPITAL_COMMUNITY): Payer: Medicare Other

## 2016-08-06 ENCOUNTER — Encounter (HOSPITAL_COMMUNITY): Payer: Self-pay | Admitting: Emergency Medicine

## 2016-08-06 ENCOUNTER — Emergency Department (HOSPITAL_COMMUNITY)
Admission: EM | Admit: 2016-08-06 | Discharge: 2016-08-07 | Disposition: A | Discharge status: Home / Self Care | Payer: Medicare Other | Attending: Emergency Medicine | Admitting: Emergency Medicine

## 2016-08-06 DIAGNOSIS — S79911A Unspecified injury of right hip, initial encounter: Secondary | ICD-10-CM | POA: Diagnosis not present

## 2016-08-06 DIAGNOSIS — M25552 Pain in left hip: Secondary | ICD-10-CM | POA: Diagnosis not present

## 2016-08-06 DIAGNOSIS — S41109A Unspecified open wound of unspecified upper arm, initial encounter: Secondary | ICD-10-CM | POA: Diagnosis not present

## 2016-08-06 DIAGNOSIS — Y939 Activity, unspecified: Secondary | ICD-10-CM | POA: Diagnosis not present

## 2016-08-06 DIAGNOSIS — Z79899 Other long term (current) drug therapy: Secondary | ICD-10-CM | POA: Insufficient documentation

## 2016-08-06 DIAGNOSIS — Z853 Personal history of malignant neoplasm of breast: Secondary | ICD-10-CM | POA: Diagnosis not present

## 2016-08-06 DIAGNOSIS — Y92009 Unspecified place in unspecified non-institutional (private) residence as the place of occurrence of the external cause: Secondary | ICD-10-CM | POA: Diagnosis not present

## 2016-08-06 DIAGNOSIS — M25559 Pain in unspecified hip: Secondary | ICD-10-CM

## 2016-08-06 DIAGNOSIS — T148XXA Other injury of unspecified body region, initial encounter: Secondary | ICD-10-CM | POA: Diagnosis not present

## 2016-08-06 DIAGNOSIS — W19XXXA Unspecified fall, initial encounter: Secondary | ICD-10-CM

## 2016-08-06 DIAGNOSIS — W1830XA Fall on same level, unspecified, initial encounter: Secondary | ICD-10-CM | POA: Insufficient documentation

## 2016-08-06 DIAGNOSIS — Z7982 Long term (current) use of aspirin: Secondary | ICD-10-CM | POA: Diagnosis not present

## 2016-08-06 DIAGNOSIS — I10 Essential (primary) hypertension: Secondary | ICD-10-CM | POA: Diagnosis not present

## 2016-08-06 DIAGNOSIS — M25551 Pain in right hip: Secondary | ICD-10-CM | POA: Insufficient documentation

## 2016-08-06 DIAGNOSIS — Y999 Unspecified external cause status: Secondary | ICD-10-CM | POA: Insufficient documentation

## 2016-08-06 DIAGNOSIS — S299XXA Unspecified injury of thorax, initial encounter: Secondary | ICD-10-CM | POA: Diagnosis not present

## 2016-08-06 MED ORDER — ACETAMINOPHEN 325 MG PO TABS
650.0000 mg | ORAL_TABLET | Freq: Once | ORAL | Status: AC
  Administered 2016-08-06: 650 mg via ORAL
  Filled 2016-08-06: qty 2

## 2016-08-06 MED ORDER — LIDOCAINE 5 % EX PTCH
1.0000 | MEDICATED_PATCH | CUTANEOUS | Status: DC
  Administered 2016-08-06: 1 via TRANSDERMAL
  Filled 2016-08-06: qty 1

## 2016-08-06 NOTE — ED Notes (Signed)
Patient transported to X-ray 

## 2016-08-06 NOTE — ED Provider Notes (Signed)
[place] DEPT Provider Note   CSN: [id number] Arrival date & time: 08/06/16  2038   By signing my name below, I, Rhonda Owen, attest that this documentation has been prepared under the direction and in the presence of [employer], [place]. Electronically Signed: Neta Owen, ED Scribe. 08/06/2016. 10:32 PM.   History   Chief Complaint Chief Complaint  Patient presents with  . Fall   The history is provided by the patient. No language interpreter was used.   HPI Comments:  Rhonda Owen is a 83 y.o. female who presents to the Emergency Department s/p fall that occurred PTA. Son reports that she fell on her porch after getting her feet caught in her walker, and she was unable to get up. Pt cannot recall how she braced her fall. She was found by a family member, and they are unsure of how long she was on the ground. Pt has Life Alert but did not use it. Pt complains of associated neck pain, bilateral hip pain, and a wound on her left elbow. She denies any LOC/head injury. No alleviating factors noted. Pt denies other associated symptoms.    Past Medical History:  Diagnosis Date  . Arthritis   . Cervical (neck) region somatic dysfunction    pt is unable to lift head  . 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 Hearing difficulty of both ears   . History of kidney stones   . Hypertension   . Osteoporosis   . Stress fracture of femoral neck 2005    Patient Active Problem List   Diagnosis Date Noted  . Breast cancer of upper-outer quadrant of right female breast ([place]) 08/27/2013    Past Surgical History:  Procedure Laterality Date  . BREAST LUMPECTOMY WITH NEEDLE LOCALIZATION Right 01/07/2014   Procedure: RIGHT BREAST LUMPECTOMY WITH NEEDLE LOCALIZATION;  Surgeon: Alphonsa Overall, MD;  Location: [place];  Service: General;  Laterality: Right;  . CHOLECYSTECTOMY    . ENUCLEATION Left 2005  . EYE SURGERY    . kidney stones  1989  . RETINAL DETACHMENT SURGERY Left      OB History    No data available       Home Medications    Prior to Admission medications   Medication Sig Start Date End Date Taking? Authorizing Provider  amLODipine (NORVASC) 5 MG tablet Take 2.5 mg by mouth daily.    Yes Historical Provider, MD  aspirin EC 81 MG tablet Take 81 mg by mouth daily.   Yes Historical Provider, MD  Calcium Carbonate-Vitamin D (CALCIUM 600+D) 600-400 MG-UNIT tablet Take 1 tablet by mouth daily.   Yes Historical Provider, MD  furosemide (LASIX) 20 MG tablet Take 20 mg by mouth daily.    Yes Historical Provider, MD  losartan (COZAAR) 50 MG tablet Take 25 mg by mouth daily.    Yes Historical Provider, MD  Multiple Vitamin (MULTIVITAMIN WITH MINERALS) TABS tablet Take 1 tablet by mouth daily.   Yes Historical Provider, MD  raloxifene (EVISTA) 60 MG tablet Take 60 mg by mouth daily.   Yes Historical Provider, MD  Travoprost, BAK Free, (TRAVATAN) 0.004 % SOLN ophthalmic solution Place 1 drop into both eyes at bedtime.    Yes Historical Provider, MD    Family History Family History  Problem Relation Age of Onset  . Heart disease Mother   . Heart disease Father     Social History Social History  Substance Use Topics  .  Smoking status: Never Smoker  . Smokeless tobacco: Never Used  . Alcohol use No     Allergies   Patient has no known allergies.   Review of Systems Review of Systems All systems reviewed and are negative for acute change except as noted in the HPI.   Physical Exam Updated Vital Signs BP (!) 114/49 (BP Location: Right Arm)   Pulse 84   Temp 97.8 F (36.6 C) (Oral)   Resp 18   SpO2 95%   Physical Exam  Constitutional: She is oriented to person, place, and time. She appears well-developed and well-nourished. No distress.  Chronically thin and frail appearing; nontoxic.  HENT:  Head: Normocephalic and atraumatic.  No Battle sign or raccoons eyes. No contusion or hematoma to scalp.  Eyes: Conjunctivae and EOM are  normal. Pupils are equal, round, and reactive to light. No scleral icterus.  Neck:  No tenderness to palpation of the cervical midline. No bony deformities, step-offs, or crepitus.  Cardiovascular: Normal rate, regular rhythm and intact distal pulses.   Pulmonary/Chest: Effort normal. No respiratory distress. She has no wheezes.  Respirations even and unlabored  Musculoskeletal: Normal range of motion.  Tenderness to palpation to right hip with limited range of motion of the right lower extremity with hip flexion secondary to discomfort. No leg shortening or malrotation. No crepitus.  Neurological: She is alert and oriented to person, place, and time. She exhibits normal muscle tone. Coordination normal.  Sensation to light touch intact in all extremities. No cranial nerve deficits appreciated on exam. Patient answers questions appropriately and follows commands. GCS 15.  Skin: Skin is warm and dry. No rash noted. She is not diaphoretic. No erythema. No pallor.  Psychiatric: She has a normal mood and affect. Her behavior is normal.  Nursing note and vitals reviewed.    ED Treatments / Results  DIAGNOSTIC STUDIES:  Oxygen Saturation is 95% on RA, normal by my interpretation.    COORDINATION OF CARE:  10:04 PM Discussed treatment plan with pt at bedside and pt agreed to plan.   Labs (all labs ordered are listed, but only abnormal results are displayed) Labs Reviewed - No data to display  EKG  EKG Interpretation None       Radiology Dg Chest 2 View  Result Date: 08/06/2016 CLINICAL DATA:  Right hip pain after falling tonight. EXAM: CHEST  2 VIEW COMPARISON:  06/08/2016 FINDINGS: Mild linear left base opacities may be atelectatic. No confluent airspace consolidation. No effusion. No pneumothorax. Normal pulmonary vasculature. The bones appear osteoporotic. IMPRESSION: Mild atelectatic appearing linear left base opacities. No consolidation or effusion. Electronically Signed   By:  Andreas Newport M.D.   On: 08/06/2016 23:42   Dg Hip Unilat W Or Wo Pelvis 2-3 Views Right  Result Date: 08/06/2016 CLINICAL DATA:  Hip pain after falling at home this evening EXAM: DG HIP (WITH OR WITHOUT PELVIS) 2-3V RIGHT COMPARISON:  CT 06/08/2016 FINDINGS: Chronic arthritic changes of the right hip with protrusio. No evidence of acute fracture. Pubic symphysis and sacroiliac joints appear intact. No bone lesion or bony destruction. IMPRESSION: Negative for acute fracture. Severe right hip arthritis with protrusio. Sensitivity for detection of a nondisplaced fracture is reduced due to poor bone density. Electronically Signed   By: Andreas Newport M.D.   On: 08/06/2016 23:44     Procedures Procedures (including critical care time)  Medications Ordered in ED Medications - No data to display   Initial Impression / Assessment and Plan /  ED Course  I have reviewed the triage vital signs and the nursing notes.  Pertinent labs & imaging results that were available during my care of the patient were reviewed by me and considered in my medical decision making (see chart for details).     83 year old female presents to the emergency department via EMS after a fall at home. Patient states that she missed her footing walking in her home from her porch. Family believes the patient may have hit her head. Patient denies this. I have offered imaging with CT of the patient's head. Family and patient decline. Family expresses understanding that we are unable to definitively rule out any intracranial traumatic injuries such as hemorrhage or skull fracture without this imaging.  Patient primarily complaining of exacerbation of known chronic pain; also complaining of hip pain. Family requesting chest x-ray to evaluate for rib fractures. X-rays performed which do not show any evidence of orthopedic trauma. Patient was subsequently ambulated in the emergency department with a walker. She uses a walker at  home for ambulation at baseline. Patient able to ambulate around the entirety of the department without additional assistance.   Family expresses comfort with discharge given reassuring workup and patient's ability to ambulate. Tylenol advised for pain and return precautions given. Patient discharged in stable condition. Patient and family with no unaddressed concerns.   Final Clinical Impressions(s) / ED Diagnoses   Final diagnoses:  Fall, initial encounter  Arthralgia of hip, unspecified laterality    New Prescriptions New Prescriptions   No medications on file   I personally performed the services described in this documentation, which was scribed in my presence. The recorded information has been reviewed and is accurate.       Antonietta Breach, PA-C 08/21/16 [id number]    Julianne Rice, MD 08/28/16 [phone]

## 2016-08-06 NOTE — Progress Notes (Signed)
CSW spoke with pt's son via phone re: d/c options for pt, including SNF, ALF and HHC options.  Pt has Medicare primary and would require a 3 midnight IP stay in order for Medicare to pay for SNF.  Option to private pay for SNF discussed, as well as ALF placement, private pay vs Medicaid Special Assistance for such.  HHC, including [facility] SW who could assist with placement from the home also explained.  Son given contact numbers to reach either [facility] CSW or [facility] CSW tomorrow should further questions arise.  Creta Levin, LCSW Evening/ED Coverage [phone]

## 2016-08-06 NOTE — ED Triage Notes (Signed)
Pt brought in by EMS after she fell on her screened in porch   Pt lives alone and called on her life alert button  Pt states she did not hurt anything and did not hit her head  Pt states she just tripped and fell  Pt has a skin tear on her left elbow  No other injuries to note

## 2016-08-07 NOTE — ED Notes (Signed)
Pt was slow to sit up, but once she got in a walker, she ambulated with a steady, stooped gait and wanted to walk all the way around the nurse's station.

## 2016-08-13 DIAGNOSIS — I10 Essential (primary) hypertension: Secondary | ICD-10-CM | POA: Diagnosis not present

## 2016-08-13 DIAGNOSIS — L89312 Pressure ulcer of right buttock, stage 2: Secondary | ICD-10-CM | POA: Diagnosis not present

## 2016-08-13 DIAGNOSIS — M810 Age-related osteoporosis without current pathological fracture: Secondary | ICD-10-CM | POA: Diagnosis not present

## 2016-08-13 DIAGNOSIS — L89322 Pressure ulcer of left buttock, stage 2: Secondary | ICD-10-CM | POA: Diagnosis not present

## 2016-08-13 DIAGNOSIS — E785 Hyperlipidemia, unspecified: Secondary | ICD-10-CM | POA: Diagnosis not present

## 2016-08-13 DIAGNOSIS — Z853 Personal history of malignant neoplasm of breast: Secondary | ICD-10-CM | POA: Diagnosis not present

## 2016-08-20 DIAGNOSIS — L89312 Pressure ulcer of right buttock, stage 2: Secondary | ICD-10-CM | POA: Diagnosis not present

## 2016-08-20 DIAGNOSIS — E785 Hyperlipidemia, unspecified: Secondary | ICD-10-CM | POA: Diagnosis not present

## 2016-08-20 DIAGNOSIS — I10 Essential (primary) hypertension: Secondary | ICD-10-CM | POA: Diagnosis not present

## 2016-08-20 DIAGNOSIS — L89322 Pressure ulcer of left buttock, stage 2: Secondary | ICD-10-CM | POA: Diagnosis not present

## 2016-08-20 DIAGNOSIS — M810 Age-related osteoporosis without current pathological fracture: Secondary | ICD-10-CM | POA: Diagnosis not present

## 2016-08-20 DIAGNOSIS — Z853 Personal history of malignant neoplasm of breast: Secondary | ICD-10-CM | POA: Diagnosis not present

## 2016-08-22 DIAGNOSIS — Z853 Personal history of malignant neoplasm of breast: Secondary | ICD-10-CM | POA: Diagnosis not present

## 2016-08-22 DIAGNOSIS — L89322 Pressure ulcer of left buttock, stage 2: Secondary | ICD-10-CM | POA: Diagnosis not present

## 2016-08-22 DIAGNOSIS — I10 Essential (primary) hypertension: Secondary | ICD-10-CM | POA: Diagnosis not present

## 2016-08-22 DIAGNOSIS — L89312 Pressure ulcer of right buttock, stage 2: Secondary | ICD-10-CM | POA: Diagnosis not present

## 2016-08-22 DIAGNOSIS — M810 Age-related osteoporosis without current pathological fracture: Secondary | ICD-10-CM | POA: Diagnosis not present

## 2016-08-22 DIAGNOSIS — E785 Hyperlipidemia, unspecified: Secondary | ICD-10-CM | POA: Diagnosis not present

## 2016-08-22 DIAGNOSIS — Z9181 History of falling: Secondary | ICD-10-CM | POA: Diagnosis not present

## 2016-08-26 DIAGNOSIS — M810 Age-related osteoporosis without current pathological fracture: Secondary | ICD-10-CM | POA: Diagnosis not present

## 2016-08-26 DIAGNOSIS — L89312 Pressure ulcer of right buttock, stage 2: Secondary | ICD-10-CM | POA: Diagnosis not present

## 2016-08-26 DIAGNOSIS — L89322 Pressure ulcer of left buttock, stage 2: Secondary | ICD-10-CM | POA: Diagnosis not present

## 2016-08-26 DIAGNOSIS — I10 Essential (primary) hypertension: Secondary | ICD-10-CM | POA: Diagnosis not present

## 2016-08-26 DIAGNOSIS — E785 Hyperlipidemia, unspecified: Secondary | ICD-10-CM | POA: Diagnosis not present

## 2016-08-26 DIAGNOSIS — Z853 Personal history of malignant neoplasm of breast: Secondary | ICD-10-CM | POA: Diagnosis not present

## 2016-08-29 DIAGNOSIS — I10 Essential (primary) hypertension: Secondary | ICD-10-CM | POA: Diagnosis not present

## 2016-08-29 DIAGNOSIS — L89322 Pressure ulcer of left buttock, stage 2: Secondary | ICD-10-CM | POA: Diagnosis not present

## 2016-08-29 DIAGNOSIS — M810 Age-related osteoporosis without current pathological fracture: Secondary | ICD-10-CM | POA: Diagnosis not present

## 2016-08-29 DIAGNOSIS — E785 Hyperlipidemia, unspecified: Secondary | ICD-10-CM | POA: Diagnosis not present

## 2016-08-29 DIAGNOSIS — Z853 Personal history of malignant neoplasm of breast: Secondary | ICD-10-CM | POA: Diagnosis not present

## 2016-08-29 DIAGNOSIS — L89312 Pressure ulcer of right buttock, stage 2: Secondary | ICD-10-CM | POA: Diagnosis not present

## 2016-08-30 DIAGNOSIS — L89312 Pressure ulcer of right buttock, stage 2: Secondary | ICD-10-CM | POA: Diagnosis not present

## 2016-08-30 DIAGNOSIS — M810 Age-related osteoporosis without current pathological fracture: Secondary | ICD-10-CM | POA: Diagnosis not present

## 2016-08-30 DIAGNOSIS — I10 Essential (primary) hypertension: Secondary | ICD-10-CM | POA: Diagnosis not present

## 2016-08-30 DIAGNOSIS — L89322 Pressure ulcer of left buttock, stage 2: Secondary | ICD-10-CM | POA: Diagnosis not present

## 2016-08-30 DIAGNOSIS — Z853 Personal history of malignant neoplasm of breast: Secondary | ICD-10-CM | POA: Diagnosis not present

## 2016-08-30 DIAGNOSIS — E785 Hyperlipidemia, unspecified: Secondary | ICD-10-CM | POA: Diagnosis not present

## 2016-09-02 DIAGNOSIS — L89312 Pressure ulcer of right buttock, stage 2: Secondary | ICD-10-CM | POA: Diagnosis not present

## 2016-09-02 DIAGNOSIS — Z853 Personal history of malignant neoplasm of breast: Secondary | ICD-10-CM | POA: Diagnosis not present

## 2016-09-02 DIAGNOSIS — M810 Age-related osteoporosis without current pathological fracture: Secondary | ICD-10-CM | POA: Diagnosis not present

## 2016-09-02 DIAGNOSIS — L89322 Pressure ulcer of left buttock, stage 2: Secondary | ICD-10-CM | POA: Diagnosis not present

## 2016-09-02 DIAGNOSIS — E785 Hyperlipidemia, unspecified: Secondary | ICD-10-CM | POA: Diagnosis not present

## 2016-09-02 DIAGNOSIS — I10 Essential (primary) hypertension: Secondary | ICD-10-CM | POA: Diagnosis not present

## 2016-09-03 DIAGNOSIS — M810 Age-related osteoporosis without current pathological fracture: Secondary | ICD-10-CM | POA: Diagnosis not present

## 2016-09-03 DIAGNOSIS — L89312 Pressure ulcer of right buttock, stage 2: Secondary | ICD-10-CM | POA: Diagnosis not present

## 2016-09-03 DIAGNOSIS — L89322 Pressure ulcer of left buttock, stage 2: Secondary | ICD-10-CM | POA: Diagnosis not present

## 2016-09-03 DIAGNOSIS — E785 Hyperlipidemia, unspecified: Secondary | ICD-10-CM | POA: Diagnosis not present

## 2016-09-03 DIAGNOSIS — Z853 Personal history of malignant neoplasm of breast: Secondary | ICD-10-CM | POA: Diagnosis not present

## 2016-09-03 DIAGNOSIS — I10 Essential (primary) hypertension: Secondary | ICD-10-CM | POA: Diagnosis not present

## 2016-09-05 DIAGNOSIS — L89322 Pressure ulcer of left buttock, stage 2: Secondary | ICD-10-CM | POA: Diagnosis not present

## 2016-09-05 DIAGNOSIS — Z853 Personal history of malignant neoplasm of breast: Secondary | ICD-10-CM | POA: Diagnosis not present

## 2016-09-05 DIAGNOSIS — E785 Hyperlipidemia, unspecified: Secondary | ICD-10-CM | POA: Diagnosis not present

## 2016-09-05 DIAGNOSIS — M810 Age-related osteoporosis without current pathological fracture: Secondary | ICD-10-CM | POA: Diagnosis not present

## 2016-09-05 DIAGNOSIS — I10 Essential (primary) hypertension: Secondary | ICD-10-CM | POA: Diagnosis not present

## 2016-09-05 DIAGNOSIS — L89312 Pressure ulcer of right buttock, stage 2: Secondary | ICD-10-CM | POA: Diagnosis not present

## 2016-09-10 DIAGNOSIS — M810 Age-related osteoporosis without current pathological fracture: Secondary | ICD-10-CM | POA: Diagnosis not present

## 2016-09-10 DIAGNOSIS — E785 Hyperlipidemia, unspecified: Secondary | ICD-10-CM | POA: Diagnosis not present

## 2016-09-10 DIAGNOSIS — I10 Essential (primary) hypertension: Secondary | ICD-10-CM | POA: Diagnosis not present

## 2016-09-10 DIAGNOSIS — Z853 Personal history of malignant neoplasm of breast: Secondary | ICD-10-CM | POA: Diagnosis not present

## 2016-09-10 DIAGNOSIS — L89312 Pressure ulcer of right buttock, stage 2: Secondary | ICD-10-CM | POA: Diagnosis not present

## 2016-09-10 DIAGNOSIS — L89322 Pressure ulcer of left buttock, stage 2: Secondary | ICD-10-CM | POA: Diagnosis not present

## 2016-09-11 DIAGNOSIS — M810 Age-related osteoporosis without current pathological fracture: Secondary | ICD-10-CM | POA: Diagnosis not present

## 2016-09-11 DIAGNOSIS — Z853 Personal history of malignant neoplasm of breast: Secondary | ICD-10-CM | POA: Diagnosis not present

## 2016-09-11 DIAGNOSIS — L89312 Pressure ulcer of right buttock, stage 2: Secondary | ICD-10-CM | POA: Diagnosis not present

## 2016-09-11 DIAGNOSIS — I10 Essential (primary) hypertension: Secondary | ICD-10-CM | POA: Diagnosis not present

## 2016-09-11 DIAGNOSIS — L89322 Pressure ulcer of left buttock, stage 2: Secondary | ICD-10-CM | POA: Diagnosis not present

## 2016-09-11 DIAGNOSIS — E785 Hyperlipidemia, unspecified: Secondary | ICD-10-CM | POA: Diagnosis not present

## 2016-09-13 DIAGNOSIS — M810 Age-related osteoporosis without current pathological fracture: Secondary | ICD-10-CM | POA: Diagnosis not present

## 2016-09-13 DIAGNOSIS — L89322 Pressure ulcer of left buttock, stage 2: Secondary | ICD-10-CM | POA: Diagnosis not present

## 2016-09-13 DIAGNOSIS — L89312 Pressure ulcer of right buttock, stage 2: Secondary | ICD-10-CM | POA: Diagnosis not present

## 2016-09-13 DIAGNOSIS — E785 Hyperlipidemia, unspecified: Secondary | ICD-10-CM | POA: Diagnosis not present

## 2016-09-13 DIAGNOSIS — Z853 Personal history of malignant neoplasm of breast: Secondary | ICD-10-CM | POA: Diagnosis not present

## 2016-09-13 DIAGNOSIS — I10 Essential (primary) hypertension: Secondary | ICD-10-CM | POA: Diagnosis not present

## 2016-09-17 DIAGNOSIS — I10 Essential (primary) hypertension: Secondary | ICD-10-CM | POA: Diagnosis not present

## 2016-09-17 DIAGNOSIS — Z853 Personal history of malignant neoplasm of breast: Secondary | ICD-10-CM | POA: Diagnosis not present

## 2016-09-17 DIAGNOSIS — M810 Age-related osteoporosis without current pathological fracture: Secondary | ICD-10-CM | POA: Diagnosis not present

## 2016-09-17 DIAGNOSIS — L89312 Pressure ulcer of right buttock, stage 2: Secondary | ICD-10-CM | POA: Diagnosis not present

## 2016-09-17 DIAGNOSIS — L89322 Pressure ulcer of left buttock, stage 2: Secondary | ICD-10-CM | POA: Diagnosis not present

## 2016-09-17 DIAGNOSIS — E785 Hyperlipidemia, unspecified: Secondary | ICD-10-CM | POA: Diagnosis not present

## 2016-09-23 DIAGNOSIS — E785 Hyperlipidemia, unspecified: Secondary | ICD-10-CM | POA: Diagnosis not present

## 2016-09-23 DIAGNOSIS — M810 Age-related osteoporosis without current pathological fracture: Secondary | ICD-10-CM | POA: Diagnosis not present

## 2016-09-23 DIAGNOSIS — L89322 Pressure ulcer of left buttock, stage 2: Secondary | ICD-10-CM | POA: Diagnosis not present

## 2016-09-23 DIAGNOSIS — I10 Essential (primary) hypertension: Secondary | ICD-10-CM | POA: Diagnosis not present

## 2016-09-23 DIAGNOSIS — L89312 Pressure ulcer of right buttock, stage 2: Secondary | ICD-10-CM | POA: Diagnosis not present

## 2016-09-23 DIAGNOSIS — Z853 Personal history of malignant neoplasm of breast: Secondary | ICD-10-CM | POA: Diagnosis not present

## 2016-09-24 DIAGNOSIS — I10 Essential (primary) hypertension: Secondary | ICD-10-CM | POA: Diagnosis not present

## 2016-09-24 DIAGNOSIS — L89322 Pressure ulcer of left buttock, stage 2: Secondary | ICD-10-CM | POA: Diagnosis not present

## 2016-09-24 DIAGNOSIS — M810 Age-related osteoporosis without current pathological fracture: Secondary | ICD-10-CM | POA: Diagnosis not present

## 2016-09-24 DIAGNOSIS — Z853 Personal history of malignant neoplasm of breast: Secondary | ICD-10-CM | POA: Diagnosis not present

## 2016-09-24 DIAGNOSIS — E785 Hyperlipidemia, unspecified: Secondary | ICD-10-CM | POA: Diagnosis not present

## 2016-09-24 DIAGNOSIS — L89312 Pressure ulcer of right buttock, stage 2: Secondary | ICD-10-CM | POA: Diagnosis not present

## 2016-10-01 DIAGNOSIS — L89312 Pressure ulcer of right buttock, stage 2: Secondary | ICD-10-CM | POA: Diagnosis not present

## 2016-10-01 DIAGNOSIS — L89322 Pressure ulcer of left buttock, stage 2: Secondary | ICD-10-CM | POA: Diagnosis not present

## 2016-10-01 DIAGNOSIS — M810 Age-related osteoporosis without current pathological fracture: Secondary | ICD-10-CM | POA: Diagnosis not present

## 2016-10-01 DIAGNOSIS — E785 Hyperlipidemia, unspecified: Secondary | ICD-10-CM | POA: Diagnosis not present

## 2016-10-01 DIAGNOSIS — Z853 Personal history of malignant neoplasm of breast: Secondary | ICD-10-CM | POA: Diagnosis not present

## 2016-10-01 DIAGNOSIS — I10 Essential (primary) hypertension: Secondary | ICD-10-CM | POA: Diagnosis not present

## 2016-10-04 DIAGNOSIS — H401113 Primary open-angle glaucoma, right eye, severe stage: Secondary | ICD-10-CM | POA: Diagnosis not present

## 2016-10-09 DIAGNOSIS — I10 Essential (primary) hypertension: Secondary | ICD-10-CM | POA: Diagnosis not present

## 2016-10-09 DIAGNOSIS — Z853 Personal history of malignant neoplasm of breast: Secondary | ICD-10-CM | POA: Diagnosis not present

## 2016-10-09 DIAGNOSIS — L89322 Pressure ulcer of left buttock, stage 2: Secondary | ICD-10-CM | POA: Diagnosis not present

## 2016-10-09 DIAGNOSIS — L89312 Pressure ulcer of right buttock, stage 2: Secondary | ICD-10-CM | POA: Diagnosis not present

## 2016-10-09 DIAGNOSIS — E785 Hyperlipidemia, unspecified: Secondary | ICD-10-CM | POA: Diagnosis not present

## 2016-10-09 DIAGNOSIS — M810 Age-related osteoporosis without current pathological fracture: Secondary | ICD-10-CM | POA: Diagnosis not present

## 2016-10-16 DIAGNOSIS — L89322 Pressure ulcer of left buttock, stage 2: Secondary | ICD-10-CM | POA: Diagnosis not present

## 2016-10-16 DIAGNOSIS — L89312 Pressure ulcer of right buttock, stage 2: Secondary | ICD-10-CM | POA: Diagnosis not present

## 2016-10-16 DIAGNOSIS — M810 Age-related osteoporosis without current pathological fracture: Secondary | ICD-10-CM | POA: Diagnosis not present

## 2016-10-16 DIAGNOSIS — E785 Hyperlipidemia, unspecified: Secondary | ICD-10-CM | POA: Diagnosis not present

## 2016-10-16 DIAGNOSIS — Z853 Personal history of malignant neoplasm of breast: Secondary | ICD-10-CM | POA: Diagnosis not present

## 2016-10-16 DIAGNOSIS — I10 Essential (primary) hypertension: Secondary | ICD-10-CM | POA: Diagnosis not present

## 2016-10-26 ENCOUNTER — Emergency Department (HOSPITAL_COMMUNITY): Payer: Medicare Other

## 2016-10-26 ENCOUNTER — Emergency Department (HOSPITAL_COMMUNITY)
Admission: EM | Admit: 2016-10-26 | Discharge: 2016-10-26 | Disposition: A | Discharge status: Home / Self Care | Payer: Medicare Other | Attending: Emergency Medicine | Admitting: Emergency Medicine

## 2016-10-26 ENCOUNTER — Encounter (HOSPITAL_COMMUNITY): Payer: Self-pay

## 2016-10-26 DIAGNOSIS — Y939 Activity, unspecified: Secondary | ICD-10-CM | POA: Diagnosis not present

## 2016-10-26 DIAGNOSIS — Y92009 Unspecified place in unspecified non-institutional (private) residence as the place of occurrence of the external cause: Secondary | ICD-10-CM

## 2016-10-26 DIAGNOSIS — Y999 Unspecified external cause status: Secondary | ICD-10-CM | POA: Diagnosis not present

## 2016-10-26 DIAGNOSIS — R296 Repeated falls: Secondary | ICD-10-CM | POA: Diagnosis not present

## 2016-10-26 DIAGNOSIS — I10 Essential (primary) hypertension: Secondary | ICD-10-CM | POA: Diagnosis not present

## 2016-10-26 DIAGNOSIS — Y92129 Unspecified place in nursing home as the place of occurrence of the external cause: Secondary | ICD-10-CM | POA: Diagnosis not present

## 2016-10-26 DIAGNOSIS — Z7982 Long term (current) use of aspirin: Secondary | ICD-10-CM | POA: Insufficient documentation

## 2016-10-26 DIAGNOSIS — W19XXXA Unspecified fall, initial encounter: Secondary | ICD-10-CM | POA: Insufficient documentation

## 2016-10-26 DIAGNOSIS — T148XXA Other injury of unspecified body region, initial encounter: Secondary | ICD-10-CM | POA: Diagnosis not present

## 2016-10-26 DIAGNOSIS — Z79899 Other long term (current) drug therapy: Secondary | ICD-10-CM | POA: Diagnosis not present

## 2016-10-26 DIAGNOSIS — M25552 Pain in left hip: Secondary | ICD-10-CM | POA: Diagnosis not present

## 2016-10-26 MED ORDER — ACETAMINOPHEN 500 MG PO TABS: 500.0000 mg | ORAL_TABLET | Freq: Four times a day (QID) | ORAL | 0 refills | Status: AC | PRN

## 2016-10-26 NOTE — ED Notes (Signed)
Patient ambulate with no difficult using a walker and SBA. Patient was able to bear weight with no problem.

## 2016-10-26 NOTE — ED Triage Notes (Signed)
Patient coming from home with c/o fall this morning. Pt lives at home by her self and was found by aid that come to check on her this morning. Pt c/o left hip pain. Pt was found laying on her left hip. Pt state went they got her up that she is not felling the pain anymore.

## 2016-10-26 NOTE — ED Notes (Signed)
Patient transported to X-ray 

## 2016-10-26 NOTE — ED Provider Notes (Signed)
[place] DEPT Provider Note   CSN: [id number] Arrival date & time: 10/26/16  0847     History   Chief Complaint Chief Complaint  Patient presents with  . Fall    HPI Rhonda Owen is a 83 y.o. female with a past medical history significant for HTN, osteoporosis and arthritis who presents today after a fall at home. Patient was found this morning on the floor by the nurse aid. Patient does not remember events leading to fall. Patient was conscious when she was found, she denies LOC. Nurse aid reports that chair lift was all the up when she found patient on the floor. Patient complained of left hip pain when the nurse try to move her back to the chair. Fall was unwitnessed, and patient was unclear how long she was on the floor. She also endorse of mild shoulder tenderness. According to nurse aid patient was laying on her left side on the floor. Patient denies any chest pain, dizziness, shortness of breath, headaches, vision changes.  HPI  Past Medical History:  Diagnosis Date  . Arthritis   . Cervical (neck) region somatic dysfunction    pt is unable to lift head  . 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 Hearing difficulty of both ears   . History of kidney stones   . Hypertension   . Osteoporosis   . Stress fracture of femoral neck 2005    Patient Active Problem List   Diagnosis Date Noted  . Breast cancer of upper-outer quadrant of right female breast ([place]) 08/27/2013    Past Surgical History:  Procedure Laterality Date  . BREAST LUMPECTOMY WITH NEEDLE LOCALIZATION Right 01/07/2014   Procedure: RIGHT BREAST LUMPECTOMY WITH NEEDLE LOCALIZATION;  Surgeon: Alphonsa Overall, MD;  Location: [place];  Service: General;  Laterality: Right;  . CHOLECYSTECTOMY    . ENUCLEATION Left 2005  . EYE SURGERY    . kidney stones  1989  . RETINAL DETACHMENT SURGERY Left     OB History    No data available       Home Medications    Prior to Admission medications     Medication Sig Start Date End Date Taking? Authorizing Provider  amLODipine (NORVASC) 5 MG tablet Take 2.5 mg by mouth daily.    Yes [provider]  aspirin EC 81 MG tablet Take 81 mg by mouth daily.   Yes [provider]  Calcium Carbonate-Vitamin D (CALCIUM 600+D) 600-400 MG-UNIT tablet Take 1 tablet by mouth daily.   Yes [provider]  furosemide (LASIX) 20 MG tablet Take 20 mg by mouth daily.    Yes [provider]  latanoprost (XALATAN) 0.005 % ophthalmic solution Place 1 drop into the right eye at bedtime. 10/04/16  Yes [provider]  losartan (COZAAR) 50 MG tablet Take 25 mg by mouth daily.    Yes [provider]  Multiple Vitamin (MULTIVITAMIN WITH MINERALS) TABS tablet Take 1 tablet by mouth daily.   Yes [provider]  raloxifene (EVISTA) 60 MG tablet Take 60 mg by mouth daily.   Yes [provider]  Travoprost, BAK Free, (TRAVATAN) 0.004 % SOLN ophthalmic solution Place 1 drop into both eyes at bedtime.    Yes [provider]  acetaminophen (TYLENOL) 500 MG tablet Take 1 tablet (500 mg total) by mouth every 6 (six) hours as needed. 10/26/16   Marjie Skiff, MD    Family History Family History  Problem Relation Age  of Onset  . Heart disease Mother   . Heart disease Father     Social History Social History  Substance Use Topics  . Smoking status: Never Smoker  . Smokeless tobacco: Never Used  . Alcohol use No     Allergies   Patient has no known allergies.   Review of Systems Review of Systems  Constitutional: Negative.   HENT: Negative.   Eyes: Negative.   Respiratory: Negative.   Cardiovascular: Negative.   Gastrointestinal: Negative.   Endocrine: Negative.   Genitourinary: Negative.   Musculoskeletal:       Left shoulder and hip pain.  Skin: Negative.   Allergic/Immunologic: Negative.   Neurological: Negative.   Hematological: Negative.   Psychiatric/Behavioral:  Negative.      Physical Exam Updated Vital Signs BP (!) 145/108 (BP Location: Right Arm)   Pulse 100   Temp 97.6 F (36.4 C) (Oral)   Resp 18   SpO2 98%   Physical Exam  Constitutional: She is oriented to person, place, and time. Vital signs are normal. She appears cachectic. She is active and cooperative.  Eyes: Conjunctivae and EOM are normal.  Enucleation of left eye.  Neck: Full passive range of motion without pain.  Cardiovascular: Normal rate, regular rhythm, S1 normal, S2 normal and normal heart sounds.   Pulmonary/Chest: Effort normal.  Abdominal: Soft. Normal appearance and bowel sounds are normal.  Musculoskeletal:       Left shoulder: She exhibits tenderness. She exhibits no deformity and no laceration.       Left hip: She exhibits tenderness and bony tenderness. She exhibits no swelling and no deformity.  Neurological: She is alert and oriented to person, place, and time.  Skin: Skin is warm and dry.  Psychiatric: She has a normal mood and affect. Her speech is normal and behavior is normal. Thought content normal.     ED Treatments / Results  Labs (all labs ordered are listed, but only abnormal results are displayed) Labs Reviewed - No data to display  EKG  EKG Interpretation None       Radiology Dg Lumbar Spine 2-3 Views  Result Date: 10/26/2016 CLINICAL DATA:  Lumbar pain.  Multiple falls. Initial encounter. EXAM: LUMBAR SPINE - 2-3 VIEW COMPARISON:  None. FINDINGS: Limited cross table lateral images of the lumbar spine, with underpenetration. No convincing fracture or traumatic malalignment. Disc height is maintained. Osteopenia. Atherosclerosis. Colonic diverticulosis. Cholecystectomy clips. Severe right hip osteoarthritis. IMPRESSION: Limited study without acute finding. Electronically Signed   By: Monte Fantasia M.D.   On: 10/26/2016 10:33   Dg Hip Unilat W Or Wo Pelvis 2-3 Views Left  Result Date: 10/26/2016 CLINICAL DATA:  Left hip pain with  multiple falls. Initial encounter. EXAM: DG HIP (WITH OR WITHOUT PELVIS) 2-3V LEFT COMPARISON:  Pelvis radiograph 07/05/2016 FINDINGS: Located left hip. No visible hip fracture. No evidence of pelvic ring fracture, limited over the sacrum due to bowel gas. Osteopenia. Severe right hip osteoarthritis with bone-on-bone contact and protrusio deformity. IMPRESSION: 1. No acute finding. 2. Severe right hip osteoarthritis with protrusio deformity. Electronically Signed   By: Monte Fantasia M.D.   On: 10/26/2016 10:29    Procedures Procedures (including critical care time)  Medications Ordered in ED Medications - No data to display   Initial Impression / Assessment and Plan / ED Course  Patient is a 83 yo female who presents after an apparent unwitnessed fall at home apparently from her chair lift. Patient complains of left hip  pain and has lumbar tenderness on exam. Vitals signs are stable and there is no sign of head trauma or LOC concerning for intracranial bleed. Left hip and lumbar Xray were negative for any acute findings. Patient was able to ambulate with walker and bear weight on leg. Patient will be discharge home where she has a nurse aid with her and family. Plan was discussed with patient and family member and they are in agreement.  I have reviewed the triage vital signs and the nursing notes.  Pertinent labs & imaging results that were available during my care of the patient were reviewed by me and considered in my medical decision making (see chart for details).     Final Clinical Impressions(s) / ED Diagnoses   Final diagnoses:  Fall in home, initial encounter  Acute hip pain, left    New Prescriptions New Prescriptions   ACETAMINOPHEN (TYLENOL) 500 MG TABLET    Take 1 tablet (500 mg total) by mouth every 6 (six) hours as needed.     Marjie Skiff, MD 10/26/16 [place], [place], MD 10/28/16 [phone]

## 2016-10-26 NOTE — ED Notes (Signed)
Bed: WA09 Expected date: 10/26/16 Expected time: 8:46 AM Means of arrival: Ambulance Comments: Fall, pelvic vs hip injury

## 2016-10-26 NOTE — Discharge Instructions (Signed)
We are sending you home with some tylenol for your hip pain. Please make sure you follow up with PCP. Please return to ED if you notice any change in mental status.

## 2017-01-06 DIAGNOSIS — I10 Essential (primary) hypertension: Secondary | ICD-10-CM | POA: Diagnosis not present

## 2017-01-06 DIAGNOSIS — R946 Abnormal results of thyroid function studies: Secondary | ICD-10-CM | POA: Diagnosis not present

## 2017-01-06 DIAGNOSIS — Z23 Encounter for immunization: Secondary | ICD-10-CM | POA: Diagnosis not present

## 2017-01-06 DIAGNOSIS — E785 Hyperlipidemia, unspecified: Secondary | ICD-10-CM | POA: Diagnosis not present

## 2017-01-06 DIAGNOSIS — M40209 Unspecified kyphosis, site unspecified: Secondary | ICD-10-CM | POA: Diagnosis not present

## 2017-01-06 DIAGNOSIS — R609 Edema, unspecified: Secondary | ICD-10-CM | POA: Diagnosis not present

## 2017-01-06 DIAGNOSIS — M810 Age-related osteoporosis without current pathological fracture: Secondary | ICD-10-CM | POA: Diagnosis not present

## 2017-03-18 ENCOUNTER — Emergency Department (HOSPITAL_COMMUNITY)
Admission: EM | Admit: 2017-03-18 | Discharge: 2017-03-18 | Disposition: A | Discharge status: Skilled Nursing Facility | Payer: Medicare Other | Attending: Emergency Medicine | Admitting: Emergency Medicine

## 2017-03-18 ENCOUNTER — Emergency Department (HOSPITAL_COMMUNITY): Payer: Medicare Other

## 2017-03-18 ENCOUNTER — Encounter (HOSPITAL_COMMUNITY): Payer: Self-pay | Admitting: Emergency Medicine

## 2017-03-18 DIAGNOSIS — Y999 Unspecified external cause status: Secondary | ICD-10-CM | POA: Insufficient documentation

## 2017-03-18 DIAGNOSIS — W010XXA Fall on same level from slipping, tripping and stumbling without subsequent striking against object, initial encounter: Secondary | ICD-10-CM | POA: Insufficient documentation

## 2017-03-18 DIAGNOSIS — S0001XA Abrasion of scalp, initial encounter: Secondary | ICD-10-CM | POA: Insufficient documentation

## 2017-03-18 DIAGNOSIS — Z7982 Long term (current) use of aspirin: Secondary | ICD-10-CM | POA: Insufficient documentation

## 2017-03-18 DIAGNOSIS — S79911A Unspecified injury of right hip, initial encounter: Secondary | ICD-10-CM | POA: Diagnosis not present

## 2017-03-18 DIAGNOSIS — W19XXXA Unspecified fall, initial encounter: Secondary | ICD-10-CM

## 2017-03-18 DIAGNOSIS — Y9389 Activity, other specified: Secondary | ICD-10-CM | POA: Insufficient documentation

## 2017-03-18 DIAGNOSIS — S70211A Abrasion, right hip, initial encounter: Secondary | ICD-10-CM | POA: Diagnosis not present

## 2017-03-18 DIAGNOSIS — Y92010 Kitchen of single-family (private) house as the place of occurrence of the external cause: Secondary | ICD-10-CM | POA: Insufficient documentation

## 2017-03-18 DIAGNOSIS — S0990XA Unspecified injury of head, initial encounter: Secondary | ICD-10-CM | POA: Diagnosis not present

## 2017-03-18 DIAGNOSIS — S50812A Abrasion of left forearm, initial encounter: Secondary | ICD-10-CM | POA: Insufficient documentation

## 2017-03-18 DIAGNOSIS — S59901A Unspecified injury of right elbow, initial encounter: Secondary | ICD-10-CM | POA: Diagnosis not present

## 2017-03-18 DIAGNOSIS — Z79899 Other long term (current) drug therapy: Secondary | ICD-10-CM | POA: Insufficient documentation

## 2017-03-18 DIAGNOSIS — S0190XA Unspecified open wound of unspecified part of head, initial encounter: Secondary | ICD-10-CM | POA: Diagnosis not present

## 2017-03-18 DIAGNOSIS — I10 Essential (primary) hypertension: Secondary | ICD-10-CM | POA: Diagnosis not present

## 2017-03-18 DIAGNOSIS — C50411 Malignant neoplasm of upper-outer quadrant of right female breast: Secondary | ICD-10-CM | POA: Insufficient documentation

## 2017-03-18 DIAGNOSIS — G8911 Acute pain due to trauma: Secondary | ICD-10-CM | POA: Diagnosis not present

## 2017-03-18 DIAGNOSIS — M25551 Pain in right hip: Secondary | ICD-10-CM | POA: Diagnosis not present

## 2017-03-18 DIAGNOSIS — S098XXA Other specified injuries of head, initial encounter: Secondary | ICD-10-CM | POA: Diagnosis not present

## 2017-03-18 DIAGNOSIS — S40811A Abrasion of right upper arm, initial encounter: Secondary | ICD-10-CM | POA: Diagnosis not present

## 2017-03-18 DIAGNOSIS — S199XXA Unspecified injury of neck, initial encounter: Secondary | ICD-10-CM | POA: Diagnosis not present

## 2017-03-18 DIAGNOSIS — R51 Headache: Secondary | ICD-10-CM | POA: Diagnosis not present

## 2017-03-18 DIAGNOSIS — M25521 Pain in right elbow: Secondary | ICD-10-CM | POA: Diagnosis not present

## 2017-03-18 MED ORDER — HYDROGEN PEROXIDE 3 % EX SOLN
CUTANEOUS | Status: AC
  Filled 2017-03-18: qty 473

## 2017-03-18 NOTE — ED Provider Notes (Signed)
[facility] DEPT Provider Note   CSN: [id number] Arrival date & time: 03/18/17  0847     History   Chief Complaint Chief Complaint  Patient presents with  . Fall    HPI Rhonda Owen is a 83 y.o. female.  HPI Patient presents after mechanical fall. Patient recalls the entirety of the event, she states that her kitchen floor was slippery, and she slipped backwards, striking her head. She denies loss of consciousness. Since the event she has had pain in her posterior head, neck, right elbow, right hip. It is unclear if she has been ambulatory. She states that she was well prior to the fall, denies taking blood thinning medication. She denies any weakness in any extremity. 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 No medication taken for relief. Per report, the patient is monitored by a family member, via video. The family member witnessed the patient falling, called EMS.  Past Medical History:  Diagnosis Date  . Arthritis   . Cervical (neck) region somatic dysfunction    pt is unable to lift head  . 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 Hearing difficulty of both ears   . History of kidney stones   . Hypertension   . Osteoporosis   . Stress fracture of femoral neck 2005    Patient Active Problem List   Diagnosis Date Noted  . Breast cancer of upper-outer quadrant of right female breast ([facility] [facility]) 08/27/2013    Past Surgical History:  Procedure Laterality Date  . BREAST LUMPECTOMY WITH NEEDLE LOCALIZATION Right 01/07/2014   Procedure: RIGHT BREAST LUMPECTOMY WITH NEEDLE LOCALIZATION;  Surgeon: Alphonsa Overall, MD;  Location: [facility];  Service: General;  Laterality: Right;  . CHOLECYSTECTOMY    . ENUCLEATION Left 2005  . EYE SURGERY    . kidney stones  1989  . RETINAL DETACHMENT SURGERY Left     OB History    No data available       Home Medications    Prior to  Admission medications   Medication Sig Start Date End Date Taking? Authorizing Provider  acetaminophen (TYLENOL) 500 MG tablet Take 1 tablet (500 mg total) by mouth every 6 (six) hours as needed. 10/26/16  Yes Diallo, Abdoulaye, MD  amLODipine (NORVASC) 5 MG tablet Take 2.5 mg by mouth daily.    Yes [provider]  aspirin EC 81 MG tablet Take 81 mg by mouth daily.   Yes [provider]  Calcium Carbonate-Vitamin D (CALCIUM 600+D) 600-400 MG-UNIT tablet Take 1 tablet by mouth daily.   Yes [provider]  carboxymethylcellulose (REFRESH PLUS) 0.5 % SOLN Place 1 drop into both eyes 3 (three) times daily as needed (for dry eyes).   Yes [provider]  furosemide (LASIX) 20 MG tablet Take 20 mg by mouth daily.    Yes [provider]  ibuprofen (ADVIL,MOTRIN) 200 MG tablet Take 200 mg by mouth every 6 (six) hours as needed for mild pain.   Yes [provider]  latanoprost (XALATAN) 0.005 % ophthalmic solution Place 1 drop into the right eye at bedtime. 10/04/16  Yes [provider]  losartan (COZAAR) 50 MG tablet Take 25 mg by mouth daily.    Yes [provider]  Multiple Vitamin (MULTIVITAMIN WITH MINERALS) TABS tablet Take 1 tablet by mouth daily.   Yes [provider]  raloxifene (EVISTA) 60 MG tablet  Take 60 mg by mouth daily.   Yes [provider]  Travoprost, BAK Free, (TRAVATAN) 0.004 % SOLN ophthalmic solution Place 1 drop into both eyes at bedtime.    Yes [provider]    Family History Family History  Problem Relation Age of Onset  . Heart disease Mother   . Heart disease Father     Social History Social History   Tobacco Use  . Smoking status: Never Smoker  . Smokeless tobacco: Never Used  Substance Use Topics  . Alcohol use: No  . Drug use: No     Allergies   Patient has no known allergies.   Review of Systems Review of Systems  Constitutional:       Per HPI,  otherwise negative  HENT:       Per HPI, otherwise negative  Eyes:       Prior L eye enucleation  Respiratory:       Per HPI, otherwise negative  Cardiovascular:       Per HPI, otherwise negative  Gastrointestinal: Negative for vomiting.  Endocrine:       Negative aside from HPI  Genitourinary:       Neg aside from HPI   Musculoskeletal:       Per HPI, otherwise negative  Skin: Positive for wound.  Neurological: Negative for syncope.  Hematological: Bruises/bleeds easily.     Physical Exam Updated Vital Signs BP (!) 145/66 (BP Location: Left Arm)   Pulse 88   Temp 97.7 F (36.5 C) (Oral)   Resp 16   SpO2 98%   Physical Exam  Constitutional: She is oriented to person, place, and time. She appears well-developed and well-nourished. No distress.  HENT:  Head: Normocephalic.    Eyes: Conjunctivae and EOM are normal.  Neck:    Cardiovascular: Normal rate and regular rhythm.  Pulmonary/Chest: Effort normal and breath sounds normal. No stridor. No respiratory distress.  Abdominal: She exhibits no distension.  Musculoskeletal: She exhibits no edema.       Arms:      Legs: Neurological: She is alert and oriented to person, place, and time. She displays atrophy. She displays no tremor. No cranial nerve deficit.  Skin: Skin is warm and dry.  Psychiatric: She has a normal mood and affect.  Nursing note and vitals reviewed.    ED Treatments / Results  Labs (all labs ordered are listed, but only abnormal results are displayed) Labs Reviewed - No data to display  EKG  EKG Interpretation None       Radiology Dg Elbow Complete Right  Result Date: 03/18/2017 CLINICAL DATA:  Pain following fall EXAM: RIGHT ELBOW - COMPLETE 3+ VIEW COMPARISON:  None. FINDINGS: Frontal, lateral, and bilateral oblique views were obtained. There is no fracture or dislocation. No joint effusion. The joint spaces appear normal. No erosive change. IMPRESSION: No fracture or dislocation.   No appreciable arthropathy. Electronically Signed   By: Lowella Grip III M.D.   On: 03/18/2017 09:59   Ct Head Wo Contrast  Result Date: 03/18/2017 CLINICAL DATA:  Unwitnessed fall. EXAM: CT HEAD WITHOUT CONTRAST CT CERVICAL SPINE WITHOUT CONTRAST TECHNIQUE: Multidetector CT imaging of the head and cervical spine was performed following the standard protocol without intravenous contrast. Multiplanar CT image reconstructions of the cervical spine were also generated. COMPARISON:  06/08/2016 FINDINGS: CT HEAD FINDINGS Brain: There is atrophy and chronic small vessel disease changes. No acute intracranial abnormality. Specifically, no hemorrhage, hydrocephalus, mass lesion, acute infarction, or  significant intracranial injury. Vascular: No hyperdense vessel or unexpected calcification. Skull: No acute calvarial abnormality. Sinuses/Orbits: Postoperative changes in the left orbit. No acute finding. Other: None CT CERVICAL SPINE FINDINGS Alignment: Normal Skull base and vertebrae: No fracture Soft tissues and spinal canal: Prevertebral soft tissues are normal. No epidural or paraspinal hematoma. Disc levels: Diffuse degenerative disc disease with disc space narrowing and spurring. Diffuse degenerative facet disease bilaterally. Upper chest: Scarring in the apices, right greater than left. Other: Carotid artery calcifications.  No acute findings. IMPRESSION: No acute intracranial abnormality.Atrophy, chronic microvascular disease. Diffuse degenerative disc and facet disease.  No acute findings. Electronically Signed   By: Rolm Baptise M.D.   On: 03/18/2017 10:14   Ct Cervical Spine Wo Contrast  Result Date: 03/18/2017 CLINICAL DATA:  Unwitnessed fall. EXAM: CT HEAD WITHOUT CONTRAST CT CERVICAL SPINE WITHOUT CONTRAST TECHNIQUE: Multidetector CT imaging of the head and cervical spine was performed following the standard protocol without intravenous contrast. Multiplanar CT image reconstructions of the  cervical spine were also generated. COMPARISON:  06/08/2016 FINDINGS: CT HEAD FINDINGS Brain: There is atrophy and chronic small vessel disease changes. No acute intracranial abnormality. Specifically, no hemorrhage, hydrocephalus, mass lesion, acute infarction, or significant intracranial injury. Vascular: No hyperdense vessel or unexpected calcification. Skull: No acute calvarial abnormality. Sinuses/Orbits: Postoperative changes in the left orbit. No acute finding. Other: None CT CERVICAL SPINE FINDINGS Alignment: Normal Skull base and vertebrae: No fracture Soft tissues and spinal canal: Prevertebral soft tissues are normal. No epidural or paraspinal hematoma. Disc levels: Diffuse degenerative disc disease with disc space narrowing and spurring. Diffuse degenerative facet disease bilaterally. Upper chest: Scarring in the apices, right greater than left. Other: Carotid artery calcifications.  No acute findings. IMPRESSION: No acute intracranial abnormality.Atrophy, chronic microvascular disease. Diffuse degenerative disc and facet disease.  No acute findings. Electronically Signed   By: Rolm Baptise M.D.   On: 03/18/2017 10:14   Dg Hip Unilat With Pelvis 2-3 Views Right  Result Date: 03/18/2017 CLINICAL DATA:  Pain following fall EXAM: DG HIP (WITH OR WITHOUT PELVIS) 2-3V RIGHT COMPARISON:  Aug 06, 2016 FINDINGS: Frontal pelvis as well as frontal and lateral right hip images were obtained it. No fracture or dislocation is appreciable on this study. There is advanced osteoarthritis in the right hip joint with protrusio acetabuli, stable. Large subchondral cystic areas are noted in the right femoral head with cortical irregularity indicative of a degree of underlying avascular necrosis. There is moderate narrowing of the left hip joint. Sacroiliac joints appear unremarkable bilaterally. There is aortic atherosclerosis. 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 Moderate narrowing left hip joint. No acute fracture or dislocation is appreciable by radiography. There is aortic atherosclerosis. Aortic Atherosclerosis (ICD10-I70.0). Electronically Signed   By: Lowella Grip III M.D.   On: 03/18/2017 10:02    Procedures Procedures (including critical care time)  Medications Ordered in ED Medications  hydrogen peroxide 3 % external solution (not administered)     Initial Impression / Assessment and Plan / ED Course  I have reviewed the triage vital signs and the nursing notes.  Pertinent labs & imaging results that were available during my care of the patient were reviewed by me and considered in my medical decision making (see chart for details).  11:00 AM On repeat exam the patient is in similar condition. Patient's wounds clean, there is no lesion amenable to staple or suture repair on her  posterior scalp. Patient has reassuring CT, right x-ray findings, and I discussed these with multiple family members. Given patient's age, concern for living alone, and frequent falls, they requested assistance with placement. Patient will be evaluated by case management for assistance with placement, either from the emergency department or home. However, given the patient's reassuring CT scan, x-ray, preserved neurovascular status, absence of distress, patient is appropriate for discharge, medically cleared.  Final Clinical Impressions(s) / ED Diagnoses  Fall, initial encounter Abrasion, multiple Scalp abrasion   Carmin Muskrat, MD 03/18/17 1101

## 2017-03-18 NOTE — ED Notes (Signed)
Pt back from imaging

## 2017-03-18 NOTE — ED Notes (Signed)
Bed: SL75 Expected date:  Expected time:  Means of arrival:  Comments: EMS-fall-lac/skin tear

## 2017-03-18 NOTE — Clinical Social Work Note (Signed)
Clinical Social Work Assessment  Patient Details  Name: Rhonda Owen MRN: [id number] Date of Birth: 1914/07/25  Date of referral:  03/18/17               Reason for consult:  Facility Placement                Permission sought to share information with:    Permission granted to share information::     Name::        Agency::     Relationship::     Contact Information:     Housing/Transportation Living arrangements for the past 2 months:  Single Family Home Source of Information:  Adult Children(Son- patrick ) Patient Interpreter Needed:  None Criminal Activity/Legal Involvement Pertinent to Current Situation/Hospitalization:    Significant Relationships:  Adult Children Lives with:  Self Do you feel safe going back to the place where you live?  No Need for family participation in patient care:  Yes (Comment)  Care giving concerns:  Patient from home with unwitnessed fall- family requesting placement at this time.    Social Worker assessment / plan:  CSW spoke with patients son, Rhonda Owen, via bedside. Patient currently lives home alone however receives care from 7AM-3PM every day from [facility]. Son states concerns with patient returning home at this time and requesting assistance with skilled nursing placement until an assisted living facility could be found. CSW informed son of Medicare requirement and the need for private pay- son voiced understanding and ability to private pay at this time. CSW provided son with list of SNF's in the area and is awaiting families choice.   CSW has completed FL2 and requested PT evaluation for patient. Once family has decided on facility CSW will make arrangements.  Employment status:  Retired Forensic scientist:  Medicare PT Recommendations:  Not assessed at this time Information / Referral to community resources:     Patient/Family's Response to care:  Family appreciated CSW.   Patient/Family's Understanding of and Emotional  Response to Diagnosis, Current Treatment, and Prognosis:  Understands treatment and prognosis.   Emotional Assessment Appearance:  Appears stated age Attitude/Demeanor/Rapport:    Affect (typically observed):    Orientation:  Oriented to Self, Oriented to Place, Oriented to  Time Alcohol / Substance use:    Psych involvement (Current and /or in the community):  No (Comment)  Discharge Needs  Concerns to be addressed:  No discharge needs identified Readmission within the last 30 days:  No Current discharge risk:  None Barriers to Discharge:  No Barriers Identified   Weston Anna, LCSW 03/18/2017, 1:14 PM

## 2017-03-18 NOTE — Evaluation (Signed)
Physical Therapy Evaluation Patient Details Name: Rhonda Owen MRN: [id number] DOB: Jan 01, 1915 Today's Date: 03/18/2017   History of Present Illness  Rhonda Owen is 83 YO female who fell in her kitchen, sustained multiple abrasions, negative for fractures.   Clinical Impression  The patient is requiring extensive assistance for mobility since recent fall. Son present and provides information and reports patient with steady decline in ability to mobilize/ambulate. Pt admitted with above diagnosis. Pt currently with functional limitations due to the deficits listed below (see PT Problem List).  Pt will benefit from skilled PT to increase their independence and safety with mobility to allow discharge to the venue listed below.       Follow Up Recommendations SNF    Equipment Recommendations  None recommended by PT    Recommendations for Other Services       Precautions / Restrictions Precautions Precautions: Fall Precaution Comments: skin tears, right shoulder and right knee is painful, severe neck deformity/flexion, has vision in right eye only which is not good, laceration posterior skull      Mobility  Bed Mobility Overal bed mobility: Needs Assistance Bed Mobility: Supine to Sit;Sit to Supine     Supine to sit: Max assist Sit to supine: Total assist;+2 for physical assistance;+2 for safety/equipment   General bed mobility comments: assist  with trunk and legs for all mobility  Transfers                 General transfer comment: unable to stand from high gurney.  Ambulation/Gait                Stairs            Wheelchair Mobility    Modified Rankin (Stroke Patients Only)       Balance Overall balance assessment: Needs assistance Sitting-balance support: Feet supported;Bilateral upper extremity supported Sitting balance-Leahy Scale: Fair Sitting balance - Comments: does sit at midline with min guard                                      Pertinent Vitals/Pain Pain Assessment: Faces Faces Pain Scale: Hurts even more Pain Location: neck, right shoulder and right knee Pain Descriptors / Indicators: Discomfort Pain Intervention(s): Monitored during session;Limited activity within patient's tolerance    Home Living Family/patient expects to be discharged to:: Private residence Living Arrangements: Alone;Children Available Help at Discharge: Available PRN/intermittently;Personal care attendant;Family Type of Home: House       Home Layout: One level Home Equipment: Walker - 2 wheels Additional Comments: has Biomedical engineer and family except for a few hours, during which patient go up and fell.    Prior Function Level of Independence: Needs assistance   Gait / Transfers Assistance Needed: uses Rw, per son,recently barely able to ambulate with assistance. Has fallen several times           Hand Dominance        Extremity/Trunk Assessment   Upper Extremity Assessment Upper Extremity Assessment: RUE deficits/detail RUE Deficits / Details: decreased shoulder elevation    Lower Extremity Assessment Lower Extremity Assessment: RLE deficits/detail RLE Deficits / Details: has a  knee  brace, decreased knee flexion    Cervical / Trunk Assessment Cervical / Trunk Assessment: Other exceptions Cervical / Trunk Exceptions: severe  neck flexion and rotated to the left  Communication      Cognition Arousal/Alertness: Awake/alert Behavior During Therapy: [facility] for  tasks assessed/performed Overall Cognitive Status: Impaired/Different from baseline Area of Impairment: Orientation                 Orientation Level: Place;Time             General Comments: knows that she fell       General Comments      Exercises     Assessment/Plan    PT Assessment Patient needs continued PT services  PT Problem List Decreased strength;Decreased range of motion;Decreased cognition;Decreased activity  tolerance;Decreased safety awareness;Decreased balance;Decreased knowledge of precautions;Decreased mobility;Decreased knowledge of use of DME;Pain       PT Treatment Interventions Gait training;DME instruction;Functional mobility training;Therapeutic activities;Therapeutic exercise;Patient/family education    PT Goals (Current goals can be found in the Care Plan section)  Acute Rehab PT Goals Patient Stated Goal: per son, to go to a facility PT Goal Formulation: With patient/family Time For Goal Achievement: 04/01/17 Potential to Achieve Goals: Fair    Frequency Min 2X/week   Barriers to discharge Decreased caregiver support      Co-evaluation               AM-PAC PT "6 Clicks" Daily Activity  Outcome Measure Difficulty turning over in bed (including adjusting bedclothes, sheets and blankets)?: Unable Difficulty moving from lying on back to sitting on the side of the bed? : Unable Difficulty sitting down on and standing up from a chair with arms (e.g., wheelchair, bedside commode, etc,.)?: Unable Help needed moving to and from a bed to chair (including a wheelchair)?: Total Help needed walking in hospital room?: Total Help needed climbing 3-5 steps with a railing? : Total 6 Click Score: 6    End of Session   Activity Tolerance: Patient limited by fatigue;Patient limited by pain Patient left: in bed;with call bell/phone within reach;with family/visitor present Nurse Communication: Mobility status PT Visit Diagnosis: History of falling (Z91.81);Difficulty in walking, not elsewhere classified (R26.2)    Time: 6659-9357 PT Time Calculation (min) (ACUTE ONLY): 32 min   Charges:   PT Evaluation $PT Eval Moderate Complexity: 1 Mod PT Treatments $Therapeutic Activity: 8-22 mins   PT G Codes:   PT G-Codes **NOT FOR INPATIENT CLASS** Functional Assessment Tool Used: Clinical judgement;AM-PAC 6 Clicks Basic Mobility Functional Limitation: Mobility: Walking and moving  around Mobility: Walking and Moving Around Current Status (S1779): 100 percent impaired, limited or restricted Mobility: Walking and Moving Around Goal Status (T9030): At least 20 percent but less than 40 percent impaired, limited or restricted  [facility] PT [phone]   Claretha Cooper 03/18/2017, 2:21 PM Tresa Endo PT [phone]

## 2017-03-18 NOTE — ED Notes (Signed)
Bed: WHALB Expected date:  Expected time:  Means of arrival:  Comments: 

## 2017-03-18 NOTE — NC FL2 (Signed)
[place] MEDICAID FL2 LEVEL OF CARE SCREENING TOOL     IDENTIFICATION  Patient Name: Rhonda Owen Birthdate: 09/06/1914 Sex: female Admission Date (Current Location): 03/18/2017  [place] and [place] Number:  Herbalist and Address:  [facility],  [facility] [address], [place]      Provider Number: [id number]  Attending Physician Name and Address:  Carmin Muskrat, MD  Relative Name and Phone Number:       Current Level of Care: Hospital Recommended Level of Care: [place] Prior Approval Number:    Date Approved/Denied:   PASRR Number:    Discharge Plan: SNF    Current Diagnoses: Patient Active Problem List   Diagnosis Date Noted  . Breast cancer of upper-outer quadrant of right female breast ([place]) 08/27/2013    Orientation RESPIRATION BLADDER Height & Weight     Self, Time, Situation  Normal Continent Weight:   Height:     BEHAVIORAL SYMPTOMS/MOOD NEUROLOGICAL BOWEL NUTRITION STATUS        Diet(heart healthy)  AMBULATORY STATUS COMMUNICATION OF NEEDS Skin   Extensive Assist Verbally Other (Comment)(wound on back of head due to fall )                       Personal Care Assistance Level of Assistance  Bathing, Feeding, Dressing Bathing Assistance: Limited assistance Feeding assistance: Independent Dressing Assistance: Limited assistance     Functional Limitations [place]  PT (By licensed PT), OT (By licensed OT)     PT Frequency: 5 OT Frequency: 5            Contractures      Additional Factors Info  Code Status, Allergies Code Status Info: not on file  Allergies Info: NKA            Current Medications (03/18/2017):  This is the current hospital active medication list Current Facility-Administered Medications  Medication Dose Route Frequency Provider Last Rate Last Dose  . hydrogen peroxide 3 % external solution            Current  Outpatient Medications  Medication Sig Dispense Refill  . acetaminophen (TYLENOL) 500 MG tablet Take 1 tablet (500 mg total) by mouth every 6 (six) hours as needed. 30 tablet 0  . amLODipine (NORVASC) 5 MG tablet Take 2.5 mg by mouth daily.     Marland Kitchen aspirin EC 81 MG tablet Take 81 mg by mouth daily.    . Calcium Carbonate-Vitamin D (CALCIUM 600+D) 600-400 MG-UNIT tablet Take 1 tablet by mouth daily.    . carboxymethylcellulose (REFRESH PLUS) 0.5 % SOLN Place 1 drop into both eyes 3 (three) times daily as needed (for dry eyes).    . furosemide (LASIX) 20 MG tablet Take 20 mg by mouth daily.     Marland Kitchen ibuprofen (ADVIL,MOTRIN) 200 MG tablet Take 200 mg by mouth every 6 (six) hours as needed for mild pain.    Marland Kitchen latanoprost (XALATAN) 0.005 % ophthalmic solution Place 1 drop into the right eye at bedtime.  11  . losartan (COZAAR) 50 MG tablet Take 25 mg by mouth daily.     . Multiple Vitamin (MULTIVITAMIN WITH MINERALS) TABS tablet Take 1 tablet by mouth daily.    . raloxifene (EVISTA) 60 MG tablet Take 60 mg by mouth daily.    . Travoprost, BAK Free, (TRAVATAN) 0.004 % SOLN ophthalmic solution  Place 1 drop into both eyes at bedtime.        Discharge Medications: Please see discharge summary for a list of discharge medications.  Relevant Imaging Results:  Relevant Lab Results:   Additional Information SS#: [id number]  Weston Anna, LCSW

## 2017-03-18 NOTE — ED Triage Notes (Signed)
Pt from home following an unwitnesed fall. Pt's son watches pt on a camera from his home. He saw her eating breakfast and then a short time later he saw her on the floor. Pt states she remembers her fall, she states her legs just slipped out from under her. Pt takes aspirin, but no other blood thinners. Pt has skin tear on rt arm and lack on back of head. Pt denies other injuries and denies pain at this time.

## 2017-03-18 NOTE — ED Notes (Signed)
PTAR called for transport to [facility] 

## 2017-03-18 NOTE — Discharge Instructions (Signed)
As discussed, your evaluation today has been largely reassuring.  But, it is important that you monitor your condition carefully, and do not hesitate to return to the ED if you develop new, or concerning changes in your condition. ? ?Otherwise, please follow-up with your physician for appropriate ongoing care. ? ?

## 2017-03-18 NOTE — Progress Notes (Signed)
Patient has been accepted to [facility] and is able to transport to facility today. Patient and family are agreeable to pay privately at facility and son, Rhonda Owen, is enroute to sign paper work for patient. [facility] has been called for transportation and EDP has been updated.   Kingsley Spittle, [facility] Emergency Room Clinical Social Worker [phone]

## 2017-03-18 NOTE — [place] FL2 (Signed)
[place] MEDICAID FL2 LEVEL OF CARE SCREENING TOOL     IDENTIFICATION  Patient Name: Rhonda Owen Birthdate: Jul 06, 1914 Sex: female Admission Date (Current Location): 03/18/2017  [facility] and [place] Number:  Herbalist and Address:  [facility],  [place] [address] [place]      Provider Number: [id number]  Attending Physician Name and Address:  Carmin Muskrat, MD  Relative Name and Phone Number:       Current Level of Care: Hospital Recommended Level of Care: [place] Prior Approval Number:    Date Approved/Denied:   PASRR Number:    Discharge Plan: SNF    Current Diagnoses: Patient Active Problem List   Diagnosis Date Noted  . Breast cancer of upper-outer quadrant of right female breast ([place]) 08/27/2013    Orientation RESPIRATION BLADDER Height & Weight     Self, Time, Situation  Normal Continent Weight:   Height:     BEHAVIORAL SYMPTOMS/MOOD NEUROLOGICAL BOWEL NUTRITION STATUS        Diet(heart healthy )  AMBULATORY STATUS COMMUNICATION OF NEEDS Skin   Limited Assist Verbally Other (Comment)                       Personal Care Assistance Level of Assistance  Feeding, Bathing, Dressing Bathing Assistance: Limited assistance Feeding assistance: Independent Dressing Assistance: Limited assistance     Functional Limitations Info             SPECIAL CARE FACTORS FREQUENCY  PT (By licensed PT), OT (By licensed OT)     PT Frequency: 5 OT Frequency: 5            Contractures      Additional Factors Info  Code Status, Allergies Code Status Info: not of file  Allergies Info: NKA            Current Medications (03/18/2017):  This is the current hospital active medication list Current Facility-Administered Medications  Medication Dose Route Frequency Provider Last Rate Last Dose  . hydrogen peroxide 3 % external solution            Current Outpatient Medications  Medication Sig  Dispense Refill  . acetaminophen (TYLENOL) 500 MG tablet Take 1 tablet (500 mg total) by mouth every 6 (six) hours as needed. 30 tablet 0  . amLODipine (NORVASC) 5 MG tablet Take 2.5 mg by mouth daily.     Marland Kitchen aspirin EC 81 MG tablet Take 81 mg by mouth daily.    . Calcium Carbonate-Vitamin D (CALCIUM 600+D) 600-400 MG-UNIT tablet Take 1 tablet by mouth daily.    . carboxymethylcellulose (REFRESH PLUS) 0.5 % SOLN Place 1 drop into both eyes 3 (three) times daily as needed (for dry eyes).    . furosemide (LASIX) 20 MG tablet Take 20 mg by mouth daily.     Marland Kitchen ibuprofen (ADVIL,MOTRIN) 200 MG tablet Take 200 mg by mouth every 6 (six) hours as needed for mild pain.    Marland Kitchen latanoprost (XALATAN) 0.005 % ophthalmic solution Place 1 drop into the right eye at bedtime.  11  . losartan (COZAAR) 50 MG tablet Take 25 mg by mouth daily.     . Multiple Vitamin (MULTIVITAMIN WITH MINERALS) TABS tablet Take 1 tablet by mouth daily.    . raloxifene (EVISTA) 60 MG tablet Take 60 mg by mouth daily.    . Travoprost, BAK Free, (TRAVATAN) 0.004 % SOLN ophthalmic solution Place 1 drop into both eyes at  bedtime.        Discharge Medications: Please see discharge summary for a list of discharge medications.  Relevant Imaging Results:  Relevant Lab Results:   Additional [name], LCSW

## 2017-03-18 NOTE — Care Management Note (Signed)
Case Management Note  CM consulted for private pay SNF vs [facility].  Spoke with Dr. Vanita Panda to discuss options with EDP preference on placement from the ED.  Place consult for CSW.  Per CSW family is willing to pay privately for SNF LTC.  No further CM needs noted at this time.

## 2017-03-18 NOTE — ED Notes (Signed)
Laceration to head irrigated. Wound is quarter size. No active bleeding at this time. Family is at bedside.

## 2017-03-19 DIAGNOSIS — R1312 Dysphagia, oropharyngeal phase: Secondary | ICD-10-CM | POA: Diagnosis not present

## 2017-03-19 DIAGNOSIS — R52 Pain, unspecified: Secondary | ICD-10-CM | POA: Diagnosis not present

## 2017-03-19 DIAGNOSIS — H401133 Primary open-angle glaucoma, bilateral, severe stage: Secondary | ICD-10-CM | POA: Diagnosis not present

## 2017-03-19 DIAGNOSIS — E569 Vitamin deficiency, unspecified: Secondary | ICD-10-CM | POA: Diagnosis not present

## 2017-03-19 DIAGNOSIS — M25551 Pain in right hip: Secondary | ICD-10-CM | POA: Diagnosis not present

## 2017-03-19 DIAGNOSIS — I10 Essential (primary) hypertension: Secondary | ICD-10-CM | POA: Diagnosis not present

## 2017-03-19 DIAGNOSIS — M6281 Muscle weakness (generalized): Secondary | ICD-10-CM | POA: Diagnosis not present

## 2017-03-20 DIAGNOSIS — M25551 Pain in right hip: Secondary | ICD-10-CM | POA: Diagnosis not present

## 2017-03-20 DIAGNOSIS — M6281 Muscle weakness (generalized): Secondary | ICD-10-CM | POA: Diagnosis not present

## 2017-03-20 DIAGNOSIS — R1312 Dysphagia, oropharyngeal phase: Secondary | ICD-10-CM | POA: Diagnosis not present

## 2017-03-21 DIAGNOSIS — M25551 Pain in right hip: Secondary | ICD-10-CM | POA: Diagnosis not present

## 2017-03-21 DIAGNOSIS — R52 Pain, unspecified: Secondary | ICD-10-CM | POA: Diagnosis not present

## 2017-03-21 DIAGNOSIS — R1312 Dysphagia, oropharyngeal phase: Secondary | ICD-10-CM | POA: Diagnosis not present

## 2017-03-21 DIAGNOSIS — E569 Vitamin deficiency, unspecified: Secondary | ICD-10-CM | POA: Diagnosis not present

## 2017-03-21 DIAGNOSIS — I10 Essential (primary) hypertension: Secondary | ICD-10-CM | POA: Diagnosis not present

## 2017-03-21 DIAGNOSIS — M6281 Muscle weakness (generalized): Secondary | ICD-10-CM | POA: Diagnosis not present

## 2017-03-21 DIAGNOSIS — H401133 Primary open-angle glaucoma, bilateral, severe stage: Secondary | ICD-10-CM | POA: Diagnosis not present

## 2017-03-24 DIAGNOSIS — B354 Tinea corporis: Secondary | ICD-10-CM | POA: Diagnosis not present

## 2017-03-24 DIAGNOSIS — M6281 Muscle weakness (generalized): Secondary | ICD-10-CM | POA: Diagnosis not present

## 2017-03-24 DIAGNOSIS — R1312 Dysphagia, oropharyngeal phase: Secondary | ICD-10-CM | POA: Diagnosis not present

## 2017-03-24 DIAGNOSIS — M25551 Pain in right hip: Secondary | ICD-10-CM | POA: Diagnosis not present

## 2017-03-25 DIAGNOSIS — M25551 Pain in right hip: Secondary | ICD-10-CM | POA: Diagnosis not present

## 2017-03-25 DIAGNOSIS — R52 Pain, unspecified: Secondary | ICD-10-CM | POA: Diagnosis not present

## 2017-03-25 DIAGNOSIS — E569 Vitamin deficiency, unspecified: Secondary | ICD-10-CM | POA: Diagnosis not present

## 2017-03-25 DIAGNOSIS — R296 Repeated falls: Secondary | ICD-10-CM | POA: Diagnosis not present

## 2017-03-25 DIAGNOSIS — H401133 Primary open-angle glaucoma, bilateral, severe stage: Secondary | ICD-10-CM | POA: Diagnosis not present

## 2017-03-25 DIAGNOSIS — R1312 Dysphagia, oropharyngeal phase: Secondary | ICD-10-CM | POA: Diagnosis not present

## 2017-03-25 DIAGNOSIS — M6281 Muscle weakness (generalized): Secondary | ICD-10-CM | POA: Diagnosis not present

## 2017-03-25 DIAGNOSIS — I10 Essential (primary) hypertension: Secondary | ICD-10-CM | POA: Diagnosis not present

## 2017-03-26 DIAGNOSIS — M6281 Muscle weakness (generalized): Secondary | ICD-10-CM | POA: Diagnosis not present

## 2017-03-26 DIAGNOSIS — M25551 Pain in right hip: Secondary | ICD-10-CM | POA: Diagnosis not present

## 2017-03-26 DIAGNOSIS — R1312 Dysphagia, oropharyngeal phase: Secondary | ICD-10-CM | POA: Diagnosis not present

## 2017-03-27 DIAGNOSIS — M25551 Pain in right hip: Secondary | ICD-10-CM | POA: Diagnosis not present

## 2017-03-27 DIAGNOSIS — M6281 Muscle weakness (generalized): Secondary | ICD-10-CM | POA: Diagnosis not present

## 2017-03-27 DIAGNOSIS — R1312 Dysphagia, oropharyngeal phase: Secondary | ICD-10-CM | POA: Diagnosis not present

## 2017-03-28 DIAGNOSIS — M25551 Pain in right hip: Secondary | ICD-10-CM | POA: Diagnosis not present

## 2017-03-28 DIAGNOSIS — M6281 Muscle weakness (generalized): Secondary | ICD-10-CM | POA: Diagnosis not present

## 2017-03-28 DIAGNOSIS — R1312 Dysphagia, oropharyngeal phase: Secondary | ICD-10-CM | POA: Diagnosis not present

## 2017-03-29 DIAGNOSIS — M6281 Muscle weakness (generalized): Secondary | ICD-10-CM | POA: Diagnosis not present

## 2017-03-29 DIAGNOSIS — R1312 Dysphagia, oropharyngeal phase: Secondary | ICD-10-CM | POA: Diagnosis not present

## 2017-03-29 DIAGNOSIS — M25551 Pain in right hip: Secondary | ICD-10-CM | POA: Diagnosis not present

## 2017-03-30 DIAGNOSIS — R1312 Dysphagia, oropharyngeal phase: Secondary | ICD-10-CM | POA: Diagnosis not present

## 2017-03-30 DIAGNOSIS — M6281 Muscle weakness (generalized): Secondary | ICD-10-CM | POA: Diagnosis not present

## 2017-03-30 DIAGNOSIS — M25551 Pain in right hip: Secondary | ICD-10-CM | POA: Diagnosis not present

## 2017-03-31 DIAGNOSIS — R1312 Dysphagia, oropharyngeal phase: Secondary | ICD-10-CM | POA: Diagnosis not present

## 2017-03-31 DIAGNOSIS — M6281 Muscle weakness (generalized): Secondary | ICD-10-CM | POA: Diagnosis not present

## 2017-03-31 DIAGNOSIS — M25551 Pain in right hip: Secondary | ICD-10-CM | POA: Diagnosis not present

## 2017-04-01 DIAGNOSIS — R1312 Dysphagia, oropharyngeal phase: Secondary | ICD-10-CM | POA: Diagnosis not present

## 2017-04-01 DIAGNOSIS — M25551 Pain in right hip: Secondary | ICD-10-CM | POA: Diagnosis not present

## 2017-04-01 DIAGNOSIS — M6281 Muscle weakness (generalized): Secondary | ICD-10-CM | POA: Diagnosis not present

## 2017-04-02 DIAGNOSIS — R1312 Dysphagia, oropharyngeal phase: Secondary | ICD-10-CM | POA: Diagnosis not present

## 2017-04-02 DIAGNOSIS — M6281 Muscle weakness (generalized): Secondary | ICD-10-CM | POA: Diagnosis not present

## 2017-04-02 DIAGNOSIS — M25551 Pain in right hip: Secondary | ICD-10-CM | POA: Diagnosis not present

## 2017-04-03 DIAGNOSIS — M6281 Muscle weakness (generalized): Secondary | ICD-10-CM | POA: Diagnosis not present

## 2017-04-03 DIAGNOSIS — M25551 Pain in right hip: Secondary | ICD-10-CM | POA: Diagnosis not present

## 2017-04-03 DIAGNOSIS — R1312 Dysphagia, oropharyngeal phase: Secondary | ICD-10-CM | POA: Diagnosis not present

## 2017-04-04 DIAGNOSIS — R1312 Dysphagia, oropharyngeal phase: Secondary | ICD-10-CM | POA: Diagnosis not present

## 2017-04-04 DIAGNOSIS — M6281 Muscle weakness (generalized): Secondary | ICD-10-CM | POA: Diagnosis not present

## 2017-04-04 DIAGNOSIS — M25551 Pain in right hip: Secondary | ICD-10-CM | POA: Diagnosis not present

## 2017-04-05 DIAGNOSIS — R1312 Dysphagia, oropharyngeal phase: Secondary | ICD-10-CM | POA: Diagnosis not present

## 2017-04-05 DIAGNOSIS — M6281 Muscle weakness (generalized): Secondary | ICD-10-CM | POA: Diagnosis not present

## 2017-04-05 DIAGNOSIS — M25551 Pain in right hip: Secondary | ICD-10-CM | POA: Diagnosis not present

## 2017-04-07 DIAGNOSIS — R1312 Dysphagia, oropharyngeal phase: Secondary | ICD-10-CM | POA: Diagnosis not present

## 2017-04-07 DIAGNOSIS — M6281 Muscle weakness (generalized): Secondary | ICD-10-CM | POA: Diagnosis not present

## 2017-04-07 DIAGNOSIS — M25551 Pain in right hip: Secondary | ICD-10-CM | POA: Diagnosis not present

## 2017-04-08 DIAGNOSIS — M25551 Pain in right hip: Secondary | ICD-10-CM | POA: Diagnosis not present

## 2017-04-08 DIAGNOSIS — M6281 Muscle weakness (generalized): Secondary | ICD-10-CM | POA: Diagnosis not present

## 2017-04-08 DIAGNOSIS — R1312 Dysphagia, oropharyngeal phase: Secondary | ICD-10-CM | POA: Diagnosis not present

## 2017-04-09 DIAGNOSIS — R630 Anorexia: Secondary | ICD-10-CM | POA: Diagnosis not present

## 2017-04-09 DIAGNOSIS — R339 Retention of urine, unspecified: Secondary | ICD-10-CM | POA: Diagnosis not present

## 2017-04-09 DIAGNOSIS — M6281 Muscle weakness (generalized): Secondary | ICD-10-CM | POA: Diagnosis not present

## 2017-04-09 DIAGNOSIS — M25551 Pain in right hip: Secondary | ICD-10-CM | POA: Diagnosis not present

## 2017-04-09 DIAGNOSIS — R1312 Dysphagia, oropharyngeal phase: Secondary | ICD-10-CM | POA: Diagnosis not present

## 2017-04-10 DIAGNOSIS — R1312 Dysphagia, oropharyngeal phase: Secondary | ICD-10-CM | POA: Diagnosis not present

## 2017-04-10 DIAGNOSIS — M6281 Muscle weakness (generalized): Secondary | ICD-10-CM | POA: Diagnosis not present

## 2017-04-10 DIAGNOSIS — M25551 Pain in right hip: Secondary | ICD-10-CM | POA: Diagnosis not present

## 2017-04-11 DIAGNOSIS — M25551 Pain in right hip: Secondary | ICD-10-CM | POA: Diagnosis not present

## 2017-04-11 DIAGNOSIS — R339 Retention of urine, unspecified: Secondary | ICD-10-CM | POA: Diagnosis not present

## 2017-04-11 DIAGNOSIS — R300 Dysuria: Secondary | ICD-10-CM | POA: Diagnosis not present

## 2017-04-11 DIAGNOSIS — R1312 Dysphagia, oropharyngeal phase: Secondary | ICD-10-CM | POA: Diagnosis not present

## 2017-04-11 DIAGNOSIS — Z79899 Other long term (current) drug therapy: Secondary | ICD-10-CM | POA: Diagnosis not present

## 2017-04-11 DIAGNOSIS — M6281 Muscle weakness (generalized): Secondary | ICD-10-CM | POA: Diagnosis not present

## 2017-04-11 DIAGNOSIS — D72829 Elevated white blood cell count, unspecified: Secondary | ICD-10-CM | POA: Diagnosis not present

## 2017-04-11 DIAGNOSIS — R319 Hematuria, unspecified: Secondary | ICD-10-CM | POA: Diagnosis not present

## 2017-04-11 DIAGNOSIS — R630 Anorexia: Secondary | ICD-10-CM | POA: Diagnosis not present

## 2017-04-11 DIAGNOSIS — N390 Urinary tract infection, site not specified: Secondary | ICD-10-CM | POA: Diagnosis not present

## 2017-04-12 DIAGNOSIS — M6281 Muscle weakness (generalized): Secondary | ICD-10-CM | POA: Diagnosis not present

## 2017-04-12 DIAGNOSIS — R1312 Dysphagia, oropharyngeal phase: Secondary | ICD-10-CM | POA: Diagnosis not present

## 2017-04-12 DIAGNOSIS — M25551 Pain in right hip: Secondary | ICD-10-CM | POA: Diagnosis not present

## 2017-04-13 DIAGNOSIS — R1312 Dysphagia, oropharyngeal phase: Secondary | ICD-10-CM | POA: Diagnosis not present

## 2017-04-13 DIAGNOSIS — M25551 Pain in right hip: Secondary | ICD-10-CM | POA: Diagnosis not present

## 2017-04-13 DIAGNOSIS — M6281 Muscle weakness (generalized): Secondary | ICD-10-CM | POA: Diagnosis not present

## 2017-04-14 DIAGNOSIS — M25551 Pain in right hip: Secondary | ICD-10-CM | POA: Diagnosis not present

## 2017-04-14 DIAGNOSIS — R1312 Dysphagia, oropharyngeal phase: Secondary | ICD-10-CM | POA: Diagnosis not present

## 2017-04-14 DIAGNOSIS — M6281 Muscle weakness (generalized): Secondary | ICD-10-CM | POA: Diagnosis not present

## 2017-04-15 DIAGNOSIS — M25551 Pain in right hip: Secondary | ICD-10-CM | POA: Diagnosis not present

## 2017-04-15 DIAGNOSIS — M6281 Muscle weakness (generalized): Secondary | ICD-10-CM | POA: Diagnosis not present

## 2017-04-15 DIAGNOSIS — R1312 Dysphagia, oropharyngeal phase: Secondary | ICD-10-CM | POA: Diagnosis not present

## 2017-04-16 DIAGNOSIS — R1312 Dysphagia, oropharyngeal phase: Secondary | ICD-10-CM | POA: Diagnosis not present

## 2017-04-16 DIAGNOSIS — M6281 Muscle weakness (generalized): Secondary | ICD-10-CM | POA: Diagnosis not present

## 2017-04-16 DIAGNOSIS — M25551 Pain in right hip: Secondary | ICD-10-CM | POA: Diagnosis not present

## 2017-04-17 DIAGNOSIS — M25551 Pain in right hip: Secondary | ICD-10-CM | POA: Diagnosis not present

## 2017-04-17 DIAGNOSIS — R1312 Dysphagia, oropharyngeal phase: Secondary | ICD-10-CM | POA: Diagnosis not present

## 2017-04-17 DIAGNOSIS — M6281 Muscle weakness (generalized): Secondary | ICD-10-CM | POA: Diagnosis not present

## 2017-04-18 DIAGNOSIS — M6281 Muscle weakness (generalized): Secondary | ICD-10-CM | POA: Diagnosis not present

## 2017-04-18 DIAGNOSIS — R1312 Dysphagia, oropharyngeal phase: Secondary | ICD-10-CM | POA: Diagnosis not present

## 2017-04-18 DIAGNOSIS — M25551 Pain in right hip: Secondary | ICD-10-CM | POA: Diagnosis not present

## 2017-04-19 DIAGNOSIS — M25551 Pain in right hip: Secondary | ICD-10-CM | POA: Diagnosis not present

## 2017-04-19 DIAGNOSIS — R1312 Dysphagia, oropharyngeal phase: Secondary | ICD-10-CM | POA: Diagnosis not present

## 2017-04-19 DIAGNOSIS — M6281 Muscle weakness (generalized): Secondary | ICD-10-CM | POA: Diagnosis not present

## 2017-04-20 DIAGNOSIS — R1312 Dysphagia, oropharyngeal phase: Secondary | ICD-10-CM | POA: Diagnosis not present

## 2017-04-20 DIAGNOSIS — M25551 Pain in right hip: Secondary | ICD-10-CM | POA: Diagnosis not present

## 2017-04-20 DIAGNOSIS — M6281 Muscle weakness (generalized): Secondary | ICD-10-CM | POA: Diagnosis not present

## 2017-04-21 DIAGNOSIS — D72829 Elevated white blood cell count, unspecified: Secondary | ICD-10-CM | POA: Diagnosis not present

## 2017-04-21 DIAGNOSIS — M6281 Muscle weakness (generalized): Secondary | ICD-10-CM | POA: Diagnosis not present

## 2017-04-21 DIAGNOSIS — N312 Flaccid neuropathic bladder, not elsewhere classified: Secondary | ICD-10-CM | POA: Diagnosis not present

## 2017-04-21 DIAGNOSIS — R1312 Dysphagia, oropharyngeal phase: Secondary | ICD-10-CM | POA: Diagnosis not present

## 2017-04-21 DIAGNOSIS — M25551 Pain in right hip: Secondary | ICD-10-CM | POA: Diagnosis not present

## 2017-04-22 DIAGNOSIS — M6281 Muscle weakness (generalized): Secondary | ICD-10-CM | POA: Diagnosis not present

## 2017-04-22 DIAGNOSIS — M25551 Pain in right hip: Secondary | ICD-10-CM | POA: Diagnosis not present

## 2017-04-22 DIAGNOSIS — R1312 Dysphagia, oropharyngeal phase: Secondary | ICD-10-CM | POA: Diagnosis not present

## 2017-04-23 DIAGNOSIS — M25551 Pain in right hip: Secondary | ICD-10-CM | POA: Diagnosis not present

## 2017-04-23 DIAGNOSIS — M6281 Muscle weakness (generalized): Secondary | ICD-10-CM | POA: Diagnosis not present

## 2017-04-23 DIAGNOSIS — R1312 Dysphagia, oropharyngeal phase: Secondary | ICD-10-CM | POA: Diagnosis not present

## 2017-04-24 DIAGNOSIS — M6281 Muscle weakness (generalized): Secondary | ICD-10-CM | POA: Diagnosis not present

## 2017-04-24 DIAGNOSIS — R1312 Dysphagia, oropharyngeal phase: Secondary | ICD-10-CM | POA: Diagnosis not present

## 2017-04-24 DIAGNOSIS — M25551 Pain in right hip: Secondary | ICD-10-CM | POA: Diagnosis not present

## 2017-04-25 DIAGNOSIS — M25551 Pain in right hip: Secondary | ICD-10-CM | POA: Diagnosis not present

## 2017-04-25 DIAGNOSIS — E569 Vitamin deficiency, unspecified: Secondary | ICD-10-CM | POA: Diagnosis not present

## 2017-04-25 DIAGNOSIS — H401133 Primary open-angle glaucoma, bilateral, severe stage: Secondary | ICD-10-CM | POA: Diagnosis not present

## 2017-04-25 DIAGNOSIS — M6281 Muscle weakness (generalized): Secondary | ICD-10-CM | POA: Diagnosis not present

## 2017-04-25 DIAGNOSIS — R1312 Dysphagia, oropharyngeal phase: Secondary | ICD-10-CM | POA: Diagnosis not present

## 2017-04-25 DIAGNOSIS — R52 Pain, unspecified: Secondary | ICD-10-CM | POA: Diagnosis not present

## 2017-04-25 DIAGNOSIS — R296 Repeated falls: Secondary | ICD-10-CM | POA: Diagnosis not present

## 2017-04-25 DIAGNOSIS — I10 Essential (primary) hypertension: Secondary | ICD-10-CM | POA: Diagnosis not present

## 2017-04-28 DIAGNOSIS — M25551 Pain in right hip: Secondary | ICD-10-CM | POA: Diagnosis not present

## 2017-04-28 DIAGNOSIS — M6281 Muscle weakness (generalized): Secondary | ICD-10-CM | POA: Diagnosis not present

## 2017-04-28 DIAGNOSIS — R509 Fever, unspecified: Secondary | ICD-10-CM | POA: Diagnosis not present

## 2017-04-28 DIAGNOSIS — D649 Anemia, unspecified: Secondary | ICD-10-CM | POA: Diagnosis not present

## 2017-04-28 DIAGNOSIS — R1312 Dysphagia, oropharyngeal phase: Secondary | ICD-10-CM | POA: Diagnosis not present

## 2017-04-29 DIAGNOSIS — R1312 Dysphagia, oropharyngeal phase: Secondary | ICD-10-CM | POA: Diagnosis not present

## 2017-04-29 DIAGNOSIS — M25551 Pain in right hip: Secondary | ICD-10-CM | POA: Diagnosis not present

## 2017-04-29 DIAGNOSIS — M6281 Muscle weakness (generalized): Secondary | ICD-10-CM | POA: Diagnosis not present

## 2017-04-30 DIAGNOSIS — R1312 Dysphagia, oropharyngeal phase: Secondary | ICD-10-CM | POA: Diagnosis not present

## 2017-04-30 DIAGNOSIS — M25551 Pain in right hip: Secondary | ICD-10-CM | POA: Diagnosis not present

## 2017-04-30 DIAGNOSIS — M6281 Muscle weakness (generalized): Secondary | ICD-10-CM | POA: Diagnosis not present

## 2017-05-01 DIAGNOSIS — R1312 Dysphagia, oropharyngeal phase: Secondary | ICD-10-CM | POA: Diagnosis not present

## 2017-05-01 DIAGNOSIS — M25551 Pain in right hip: Secondary | ICD-10-CM | POA: Diagnosis not present

## 2017-05-01 DIAGNOSIS — M6281 Muscle weakness (generalized): Secondary | ICD-10-CM | POA: Diagnosis not present

## 2017-05-02 DIAGNOSIS — M6281 Muscle weakness (generalized): Secondary | ICD-10-CM | POA: Diagnosis not present

## 2017-05-02 DIAGNOSIS — R1312 Dysphagia, oropharyngeal phase: Secondary | ICD-10-CM | POA: Diagnosis not present

## 2017-05-02 DIAGNOSIS — M25551 Pain in right hip: Secondary | ICD-10-CM | POA: Diagnosis not present

## 2017-05-05 DIAGNOSIS — M25551 Pain in right hip: Secondary | ICD-10-CM | POA: Diagnosis not present

## 2017-05-05 DIAGNOSIS — R1312 Dysphagia, oropharyngeal phase: Secondary | ICD-10-CM | POA: Diagnosis not present

## 2017-05-05 DIAGNOSIS — M6281 Muscle weakness (generalized): Secondary | ICD-10-CM | POA: Diagnosis not present

## 2017-05-06 DIAGNOSIS — R1312 Dysphagia, oropharyngeal phase: Secondary | ICD-10-CM | POA: Diagnosis not present

## 2017-05-06 DIAGNOSIS — M6281 Muscle weakness (generalized): Secondary | ICD-10-CM | POA: Diagnosis not present

## 2017-05-06 DIAGNOSIS — M25551 Pain in right hip: Secondary | ICD-10-CM | POA: Diagnosis not present

## 2017-05-07 DIAGNOSIS — M6281 Muscle weakness (generalized): Secondary | ICD-10-CM | POA: Diagnosis not present

## 2017-05-07 DIAGNOSIS — R1312 Dysphagia, oropharyngeal phase: Secondary | ICD-10-CM | POA: Diagnosis not present

## 2017-05-07 DIAGNOSIS — M25551 Pain in right hip: Secondary | ICD-10-CM | POA: Diagnosis not present

## 2017-05-08 DIAGNOSIS — M25551 Pain in right hip: Secondary | ICD-10-CM | POA: Diagnosis not present

## 2017-05-08 DIAGNOSIS — R1312 Dysphagia, oropharyngeal phase: Secondary | ICD-10-CM | POA: Diagnosis not present

## 2017-05-08 DIAGNOSIS — M6281 Muscle weakness (generalized): Secondary | ICD-10-CM | POA: Diagnosis not present

## 2017-05-09 DIAGNOSIS — R1312 Dysphagia, oropharyngeal phase: Secondary | ICD-10-CM | POA: Diagnosis not present

## 2017-05-09 DIAGNOSIS — M25551 Pain in right hip: Secondary | ICD-10-CM | POA: Diagnosis not present

## 2017-05-09 DIAGNOSIS — M6281 Muscle weakness (generalized): Secondary | ICD-10-CM | POA: Diagnosis not present

## 2017-05-12 DIAGNOSIS — M25551 Pain in right hip: Secondary | ICD-10-CM | POA: Diagnosis not present

## 2017-05-12 DIAGNOSIS — R296 Repeated falls: Secondary | ICD-10-CM | POA: Diagnosis not present

## 2017-05-12 DIAGNOSIS — R1312 Dysphagia, oropharyngeal phase: Secondary | ICD-10-CM | POA: Diagnosis not present

## 2017-05-12 DIAGNOSIS — W19XXXA Unspecified fall, initial encounter: Secondary | ICD-10-CM | POA: Diagnosis not present

## 2017-05-12 DIAGNOSIS — M6281 Muscle weakness (generalized): Secondary | ICD-10-CM | POA: Diagnosis not present

## 2017-05-13 DIAGNOSIS — M6281 Muscle weakness (generalized): Secondary | ICD-10-CM | POA: Diagnosis not present

## 2017-05-13 DIAGNOSIS — R21 Rash and other nonspecific skin eruption: Secondary | ICD-10-CM | POA: Diagnosis not present

## 2017-05-13 DIAGNOSIS — R1312 Dysphagia, oropharyngeal phase: Secondary | ICD-10-CM | POA: Diagnosis not present

## 2017-05-13 DIAGNOSIS — M25551 Pain in right hip: Secondary | ICD-10-CM | POA: Diagnosis not present

## 2017-05-21 DIAGNOSIS — E569 Vitamin deficiency, unspecified: Secondary | ICD-10-CM | POA: Diagnosis not present

## 2017-05-21 DIAGNOSIS — R21 Rash and other nonspecific skin eruption: Secondary | ICD-10-CM | POA: Diagnosis not present

## 2017-05-21 DIAGNOSIS — H401133 Primary open-angle glaucoma, bilateral, severe stage: Secondary | ICD-10-CM | POA: Diagnosis not present

## 2017-05-21 DIAGNOSIS — I10 Essential (primary) hypertension: Secondary | ICD-10-CM | POA: Diagnosis not present

## 2017-05-21 DIAGNOSIS — R339 Retention of urine, unspecified: Secondary | ICD-10-CM | POA: Diagnosis not present

## 2017-05-21 DIAGNOSIS — R296 Repeated falls: Secondary | ICD-10-CM | POA: Diagnosis not present

## 2017-05-21 DIAGNOSIS — R52 Pain, unspecified: Secondary | ICD-10-CM | POA: Diagnosis not present

## 2017-05-21 DIAGNOSIS — M6281 Muscle weakness (generalized): Secondary | ICD-10-CM | POA: Diagnosis not present

## 2017-05-22 DIAGNOSIS — G4709 Other insomnia: Secondary | ICD-10-CM | POA: Diagnosis not present

## 2017-05-29 DIAGNOSIS — R300 Dysuria: Secondary | ICD-10-CM | POA: Diagnosis not present

## 2017-05-29 DIAGNOSIS — N390 Urinary tract infection, site not specified: Secondary | ICD-10-CM | POA: Diagnosis not present

## 2017-05-29 DIAGNOSIS — R319 Hematuria, unspecified: Secondary | ICD-10-CM | POA: Diagnosis not present

## 2017-06-06 DIAGNOSIS — N390 Urinary tract infection, site not specified: Secondary | ICD-10-CM | POA: Diagnosis not present

## 2017-06-07 DIAGNOSIS — R319 Hematuria, unspecified: Secondary | ICD-10-CM | POA: Diagnosis not present

## 2017-06-07 DIAGNOSIS — N390 Urinary tract infection, site not specified: Secondary | ICD-10-CM | POA: Diagnosis not present

## 2017-06-07 DIAGNOSIS — Z79899 Other long term (current) drug therapy: Secondary | ICD-10-CM | POA: Diagnosis not present

## 2017-06-10 DIAGNOSIS — N390 Urinary tract infection, site not specified: Secondary | ICD-10-CM | POA: Diagnosis not present

## 2017-07-09 DIAGNOSIS — R296 Repeated falls: Secondary | ICD-10-CM | POA: Diagnosis not present

## 2017-07-09 DIAGNOSIS — R52 Pain, unspecified: Secondary | ICD-10-CM | POA: Diagnosis not present

## 2017-07-09 DIAGNOSIS — M6281 Muscle weakness (generalized): Secondary | ICD-10-CM | POA: Diagnosis not present

## 2017-07-09 DIAGNOSIS — D72829 Elevated white blood cell count, unspecified: Secondary | ICD-10-CM | POA: Diagnosis not present

## 2017-07-09 DIAGNOSIS — H401133 Primary open-angle glaucoma, bilateral, severe stage: Secondary | ICD-10-CM | POA: Diagnosis not present

## 2017-07-09 DIAGNOSIS — N319 Neuromuscular dysfunction of bladder, unspecified: Secondary | ICD-10-CM | POA: Diagnosis not present

## 2017-07-09 DIAGNOSIS — I10 Essential (primary) hypertension: Secondary | ICD-10-CM | POA: Diagnosis not present

## 2017-07-09 DIAGNOSIS — R339 Retention of urine, unspecified: Secondary | ICD-10-CM | POA: Diagnosis not present

## 2017-07-09 DIAGNOSIS — E569 Vitamin deficiency, unspecified: Secondary | ICD-10-CM | POA: Diagnosis not present

## 2017-07-28 DIAGNOSIS — M6281 Muscle weakness (generalized): Secondary | ICD-10-CM | POA: Diagnosis not present

## 2017-07-28 DIAGNOSIS — R339 Retention of urine, unspecified: Secondary | ICD-10-CM | POA: Diagnosis not present

## 2017-07-28 DIAGNOSIS — D649 Anemia, unspecified: Secondary | ICD-10-CM | POA: Diagnosis not present

## 2017-07-28 DIAGNOSIS — I10 Essential (primary) hypertension: Secondary | ICD-10-CM | POA: Diagnosis not present

## 2017-07-28 DIAGNOSIS — N319 Neuromuscular dysfunction of bladder, unspecified: Secondary | ICD-10-CM | POA: Diagnosis not present

## 2017-08-01 DIAGNOSIS — N319 Neuromuscular dysfunction of bladder, unspecified: Secondary | ICD-10-CM | POA: Diagnosis not present

## 2017-08-01 DIAGNOSIS — R339 Retention of urine, unspecified: Secondary | ICD-10-CM | POA: Diagnosis not present

## 2017-08-01 DIAGNOSIS — R52 Pain, unspecified: Secondary | ICD-10-CM | POA: Diagnosis not present

## 2017-08-01 DIAGNOSIS — R54 Age-related physical debility: Secondary | ICD-10-CM | POA: Diagnosis not present

## 2017-08-01 DIAGNOSIS — D72829 Elevated white blood cell count, unspecified: Secondary | ICD-10-CM | POA: Diagnosis not present

## 2017-08-01 DIAGNOSIS — H401133 Primary open-angle glaucoma, bilateral, severe stage: Secondary | ICD-10-CM | POA: Diagnosis not present

## 2017-08-01 DIAGNOSIS — I10 Essential (primary) hypertension: Secondary | ICD-10-CM | POA: Diagnosis not present

## 2017-08-08 DIAGNOSIS — R52 Pain, unspecified: Secondary | ICD-10-CM | POA: Diagnosis not present

## 2017-08-08 DIAGNOSIS — I10 Essential (primary) hypertension: Secondary | ICD-10-CM | POA: Diagnosis not present

## 2017-08-08 DIAGNOSIS — N319 Neuromuscular dysfunction of bladder, unspecified: Secondary | ICD-10-CM | POA: Diagnosis not present

## 2017-08-08 DIAGNOSIS — H401133 Primary open-angle glaucoma, bilateral, severe stage: Secondary | ICD-10-CM | POA: Diagnosis not present

## 2017-08-08 DIAGNOSIS — R339 Retention of urine, unspecified: Secondary | ICD-10-CM | POA: Diagnosis not present

## 2017-08-08 DIAGNOSIS — D72829 Elevated white blood cell count, unspecified: Secondary | ICD-10-CM | POA: Diagnosis not present

## 2017-08-08 DIAGNOSIS — M1990 Unspecified osteoarthritis, unspecified site: Secondary | ICD-10-CM | POA: Diagnosis not present

## 2017-08-08 DIAGNOSIS — E569 Vitamin deficiency, unspecified: Secondary | ICD-10-CM | POA: Diagnosis not present

## 2017-08-08 DIAGNOSIS — H10021 Other mucopurulent conjunctivitis, right eye: Secondary | ICD-10-CM | POA: Diagnosis not present

## 2017-08-08 DIAGNOSIS — R54 Age-related physical debility: Secondary | ICD-10-CM | POA: Diagnosis not present

## 2017-09-09 DIAGNOSIS — N319 Neuromuscular dysfunction of bladder, unspecified: Secondary | ICD-10-CM | POA: Diagnosis not present

## 2017-09-09 DIAGNOSIS — R54 Age-related physical debility: Secondary | ICD-10-CM | POA: Diagnosis not present

## 2017-09-09 DIAGNOSIS — I10 Essential (primary) hypertension: Secondary | ICD-10-CM | POA: Diagnosis not present

## 2017-09-09 DIAGNOSIS — G4700 Insomnia, unspecified: Secondary | ICD-10-CM | POA: Diagnosis not present

## 2017-09-09 DIAGNOSIS — R339 Retention of urine, unspecified: Secondary | ICD-10-CM | POA: Diagnosis not present

## 2017-09-09 DIAGNOSIS — R52 Pain, unspecified: Secondary | ICD-10-CM | POA: Diagnosis not present

## 2017-09-09 DIAGNOSIS — D72829 Elevated white blood cell count, unspecified: Secondary | ICD-10-CM | POA: Diagnosis not present

## 2017-09-09 DIAGNOSIS — H401133 Primary open-angle glaucoma, bilateral, severe stage: Secondary | ICD-10-CM | POA: Diagnosis not present

## 2017-09-15 DIAGNOSIS — N319 Neuromuscular dysfunction of bladder, unspecified: Secondary | ICD-10-CM | POA: Diagnosis not present

## 2017-09-15 DIAGNOSIS — R339 Retention of urine, unspecified: Secondary | ICD-10-CM | POA: Diagnosis not present

## 2017-09-29 DIAGNOSIS — M79671 Pain in right foot: Secondary | ICD-10-CM | POA: Diagnosis not present

## 2017-09-29 DIAGNOSIS — S80811A Abrasion, right lower leg, initial encounter: Secondary | ICD-10-CM | POA: Diagnosis not present

## 2017-10-08 DIAGNOSIS — R339 Retention of urine, unspecified: Secondary | ICD-10-CM | POA: Diagnosis not present

## 2017-10-08 DIAGNOSIS — R54 Age-related physical debility: Secondary | ICD-10-CM | POA: Diagnosis not present

## 2017-10-08 DIAGNOSIS — R52 Pain, unspecified: Secondary | ICD-10-CM | POA: Diagnosis not present

## 2017-10-08 DIAGNOSIS — I10 Essential (primary) hypertension: Secondary | ICD-10-CM | POA: Diagnosis not present

## 2017-10-08 DIAGNOSIS — G4700 Insomnia, unspecified: Secondary | ICD-10-CM | POA: Diagnosis not present

## 2017-10-08 DIAGNOSIS — D72829 Elevated white blood cell count, unspecified: Secondary | ICD-10-CM | POA: Diagnosis not present

## 2017-10-08 DIAGNOSIS — H401133 Primary open-angle glaucoma, bilateral, severe stage: Secondary | ICD-10-CM | POA: Diagnosis not present

## 2017-10-08 DIAGNOSIS — N319 Neuromuscular dysfunction of bladder, unspecified: Secondary | ICD-10-CM | POA: Diagnosis not present

## 2017-10-15 DIAGNOSIS — R3981 Functional urinary incontinence: Secondary | ICD-10-CM | POA: Diagnosis not present

## 2017-10-15 DIAGNOSIS — N319 Neuromuscular dysfunction of bladder, unspecified: Secondary | ICD-10-CM | POA: Diagnosis not present

## 2017-10-15 DIAGNOSIS — R339 Retention of urine, unspecified: Secondary | ICD-10-CM | POA: Diagnosis not present

## 2017-11-05 DIAGNOSIS — G3184 Mild cognitive impairment, so stated: Secondary | ICD-10-CM | POA: Diagnosis not present

## 2017-11-06 DIAGNOSIS — N319 Neuromuscular dysfunction of bladder, unspecified: Secondary | ICD-10-CM | POA: Diagnosis not present

## 2017-11-06 DIAGNOSIS — R54 Age-related physical debility: Secondary | ICD-10-CM | POA: Diagnosis not present

## 2017-11-06 DIAGNOSIS — G4700 Insomnia, unspecified: Secondary | ICD-10-CM | POA: Diagnosis not present

## 2017-11-06 DIAGNOSIS — M1990 Unspecified osteoarthritis, unspecified site: Secondary | ICD-10-CM | POA: Diagnosis not present

## 2017-11-06 DIAGNOSIS — R52 Pain, unspecified: Secondary | ICD-10-CM | POA: Diagnosis not present

## 2017-11-06 DIAGNOSIS — H401133 Primary open-angle glaucoma, bilateral, severe stage: Secondary | ICD-10-CM | POA: Diagnosis not present

## 2017-11-06 DIAGNOSIS — I10 Essential (primary) hypertension: Secondary | ICD-10-CM | POA: Diagnosis not present

## 2017-11-06 DIAGNOSIS — R339 Retention of urine, unspecified: Secondary | ICD-10-CM | POA: Diagnosis not present

## 2017-12-12 DIAGNOSIS — R52 Pain, unspecified: Secondary | ICD-10-CM | POA: Diagnosis not present

## 2017-12-12 DIAGNOSIS — R339 Retention of urine, unspecified: Secondary | ICD-10-CM | POA: Diagnosis not present

## 2017-12-12 DIAGNOSIS — G4700 Insomnia, unspecified: Secondary | ICD-10-CM | POA: Diagnosis not present

## 2017-12-12 DIAGNOSIS — H401133 Primary open-angle glaucoma, bilateral, severe stage: Secondary | ICD-10-CM | POA: Diagnosis not present

## 2017-12-12 DIAGNOSIS — I10 Essential (primary) hypertension: Secondary | ICD-10-CM | POA: Diagnosis not present

## 2017-12-12 DIAGNOSIS — R54 Age-related physical debility: Secondary | ICD-10-CM | POA: Diagnosis not present

## 2017-12-12 DIAGNOSIS — N319 Neuromuscular dysfunction of bladder, unspecified: Secondary | ICD-10-CM | POA: Diagnosis not present

## 2017-12-12 DIAGNOSIS — M1990 Unspecified osteoarthritis, unspecified site: Secondary | ICD-10-CM | POA: Diagnosis not present

## 2017-12-12 DIAGNOSIS — R300 Dysuria: Secondary | ICD-10-CM | POA: Diagnosis not present

## 2017-12-15 DIAGNOSIS — I10 Essential (primary) hypertension: Secondary | ICD-10-CM | POA: Diagnosis not present

## 2017-12-15 DIAGNOSIS — N390 Urinary tract infection, site not specified: Secondary | ICD-10-CM | POA: Diagnosis not present

## 2017-12-15 DIAGNOSIS — R319 Hematuria, unspecified: Secondary | ICD-10-CM | POA: Diagnosis not present

## 2017-12-25 DIAGNOSIS — N319 Neuromuscular dysfunction of bladder, unspecified: Secondary | ICD-10-CM | POA: Diagnosis not present

## 2017-12-25 DIAGNOSIS — R52 Pain, unspecified: Secondary | ICD-10-CM | POA: Diagnosis not present

## 2017-12-25 DIAGNOSIS — I10 Essential (primary) hypertension: Secondary | ICD-10-CM | POA: Diagnosis not present

## 2017-12-25 DIAGNOSIS — G4700 Insomnia, unspecified: Secondary | ICD-10-CM | POA: Diagnosis not present

## 2017-12-25 DIAGNOSIS — R54 Age-related physical debility: Secondary | ICD-10-CM | POA: Diagnosis not present

## 2017-12-25 DIAGNOSIS — H401133 Primary open-angle glaucoma, bilateral, severe stage: Secondary | ICD-10-CM | POA: Diagnosis not present

## 2017-12-25 DIAGNOSIS — N390 Urinary tract infection, site not specified: Secondary | ICD-10-CM | POA: Diagnosis not present

## 2017-12-25 DIAGNOSIS — R339 Retention of urine, unspecified: Secondary | ICD-10-CM | POA: Diagnosis not present

## 2017-12-25 DIAGNOSIS — M1990 Unspecified osteoarthritis, unspecified site: Secondary | ICD-10-CM | POA: Diagnosis not present

## 2017-12-31 DIAGNOSIS — G301 Alzheimer's disease with late onset: Secondary | ICD-10-CM | POA: Diagnosis not present

## 2017-12-31 DIAGNOSIS — F0280 Dementia in other diseases classified elsewhere without behavioral disturbance: Secondary | ICD-10-CM | POA: Diagnosis not present

## 2018-01-02 DIAGNOSIS — R296 Repeated falls: Secondary | ICD-10-CM | POA: Diagnosis not present

## 2018-01-02 DIAGNOSIS — S0990XA Unspecified injury of head, initial encounter: Secondary | ICD-10-CM | POA: Diagnosis not present

## 2018-01-02 DIAGNOSIS — S0083XA Contusion of other part of head, initial encounter: Secondary | ICD-10-CM | POA: Diagnosis not present

## 2018-01-02 DIAGNOSIS — S41111D Laceration without foreign body of right upper arm, subsequent encounter: Secondary | ICD-10-CM | POA: Diagnosis not present

## 2018-01-02 DIAGNOSIS — S81011A Laceration without foreign body, right knee, initial encounter: Secondary | ICD-10-CM | POA: Diagnosis not present

## 2018-01-05 DIAGNOSIS — D649 Anemia, unspecified: Secondary | ICD-10-CM | POA: Diagnosis not present

## 2018-01-05 DIAGNOSIS — R6889 Other general symptoms and signs: Secondary | ICD-10-CM | POA: Diagnosis not present

## 2018-01-16 DIAGNOSIS — B372 Candidiasis of skin and nail: Secondary | ICD-10-CM | POA: Diagnosis not present

## 2018-01-30 DIAGNOSIS — M25552 Pain in left hip: Secondary | ICD-10-CM | POA: Diagnosis not present

## 2018-02-03 DIAGNOSIS — M1612 Unilateral primary osteoarthritis, left hip: Secondary | ICD-10-CM | POA: Diagnosis not present

## 2018-02-05 DIAGNOSIS — E569 Vitamin deficiency, unspecified: Secondary | ICD-10-CM | POA: Diagnosis not present

## 2018-02-05 DIAGNOSIS — E559 Vitamin D deficiency, unspecified: Secondary | ICD-10-CM | POA: Diagnosis not present

## 2018-03-02 DIAGNOSIS — R339 Retention of urine, unspecified: Secondary | ICD-10-CM | POA: Diagnosis not present

## 2018-03-02 DIAGNOSIS — R52 Pain, unspecified: Secondary | ICD-10-CM | POA: Diagnosis not present

## 2018-03-02 DIAGNOSIS — R300 Dysuria: Secondary | ICD-10-CM | POA: Diagnosis not present

## 2018-03-02 DIAGNOSIS — M1990 Unspecified osteoarthritis, unspecified site: Secondary | ICD-10-CM | POA: Diagnosis not present

## 2018-03-02 DIAGNOSIS — G4700 Insomnia, unspecified: Secondary | ICD-10-CM | POA: Diagnosis not present

## 2018-03-02 DIAGNOSIS — J029 Acute pharyngitis, unspecified: Secondary | ICD-10-CM | POA: Diagnosis not present

## 2018-03-02 DIAGNOSIS — H401133 Primary open-angle glaucoma, bilateral, severe stage: Secondary | ICD-10-CM | POA: Diagnosis not present

## 2018-03-02 DIAGNOSIS — I10 Essential (primary) hypertension: Secondary | ICD-10-CM | POA: Diagnosis not present

## 2018-03-02 DIAGNOSIS — N319 Neuromuscular dysfunction of bladder, unspecified: Secondary | ICD-10-CM | POA: Diagnosis not present

## 2018-03-02 DIAGNOSIS — R05 Cough: Secondary | ICD-10-CM | POA: Diagnosis not present

## 2018-03-02 DIAGNOSIS — R54 Age-related physical debility: Secondary | ICD-10-CM | POA: Diagnosis not present

## 2018-03-02 DIAGNOSIS — G309 Alzheimer's disease, unspecified: Secondary | ICD-10-CM | POA: Diagnosis not present

## 2018-03-13 DIAGNOSIS — J00 Acute nasopharyngitis [common cold]: Secondary | ICD-10-CM | POA: Diagnosis not present

## 2018-03-13 DIAGNOSIS — R05 Cough: Secondary | ICD-10-CM | POA: Diagnosis not present

## 2018-03-16 DIAGNOSIS — D649 Anemia, unspecified: Secondary | ICD-10-CM | POA: Diagnosis not present

## 2018-04-09 DIAGNOSIS — H1045 Other chronic allergic conjunctivitis: Secondary | ICD-10-CM | POA: Diagnosis not present

## 2019-04-17 IMAGING — CT CT CERVICAL SPINE W/O CM
4 of 8 series · 13 of 33 positions shown, 14 images · non-contrast
Comparison: 06/08/2016

CLINICAL DATA: Unwitnessed fall.

EXAM:
CT HEAD WITHOUT CONTRAST
CT CERVICAL SPINE WITHOUT CONTRAST
TECHNIQUE: Multidetector CT imaging of the head and cervical spine was
performed following the standard protocol without intravenous
contrast. Multiplanar CT image reconstructions of the cervical spine
were also generated.

[Series 4: c spine soft · axial · 0.36mm/px · z∈[-199,-153]mm · 2 of 69 slices shown]
[im 23/69  soft-tissue]
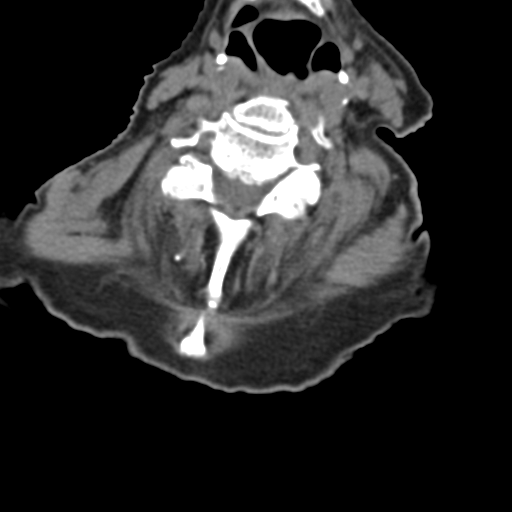
[im 46/69  soft-tissue]
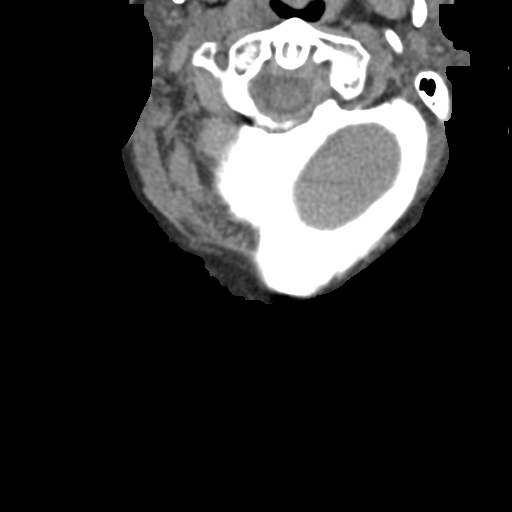

[Series 6: orthogonal bone · axial · 0.23mm/px · z∈[-248,-181]mm · 3 of 84 slices shown, 4 images]
[im 21/84  soft-tissue]
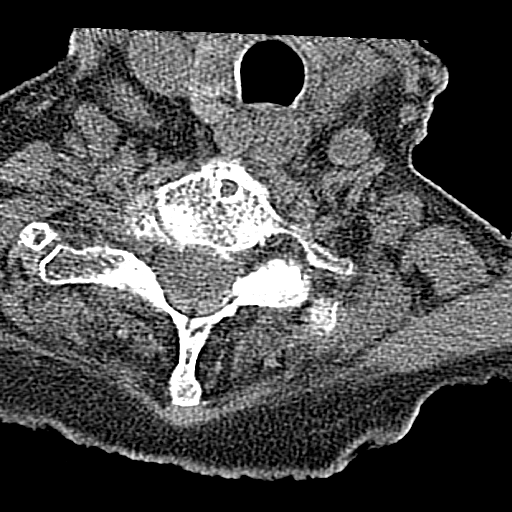
[im 21/84  bone]
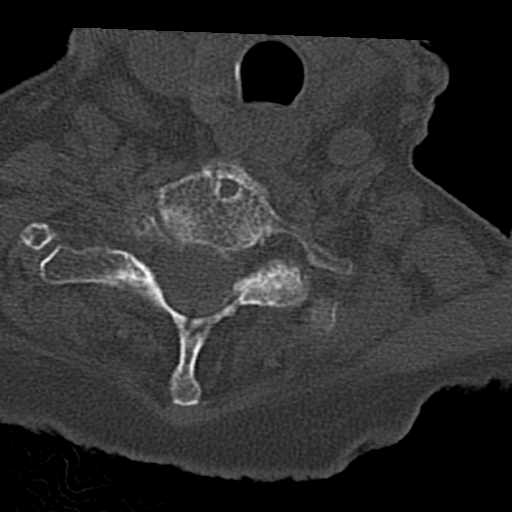
[im 42/84  bone]
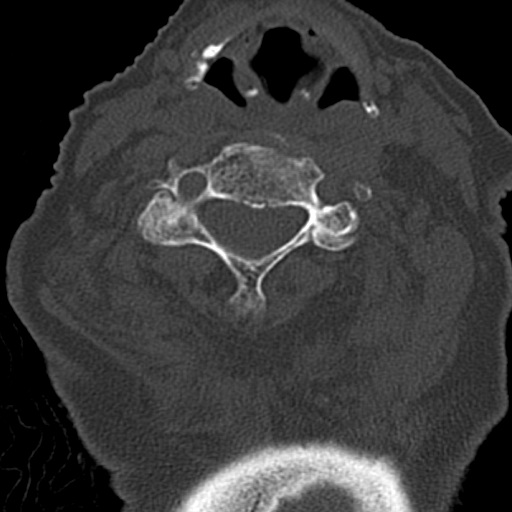
[im 63/84  bone]
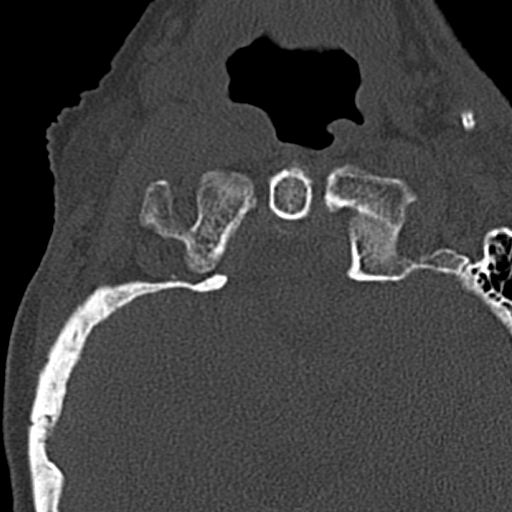

[Series 8: sagittal bone · sagittal · 0.33mm/px · 5 of 72 slices shown]
[im 12/72  bone]
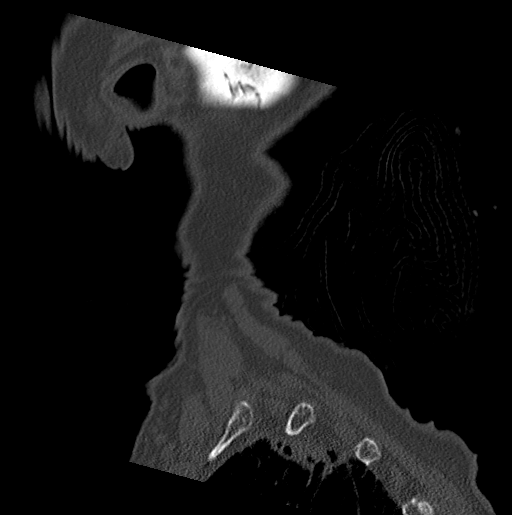
[im 24/72  bone]
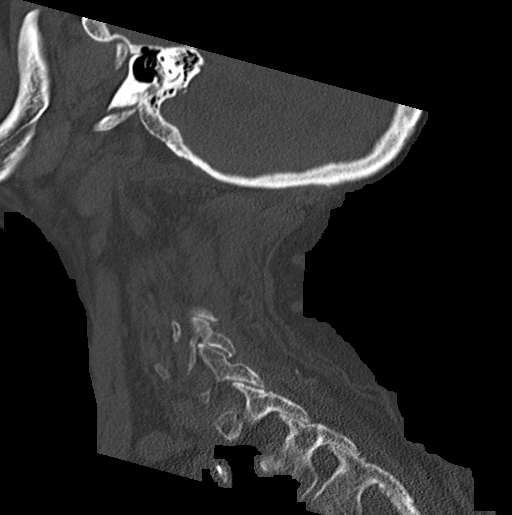
[im 36/72  bone]
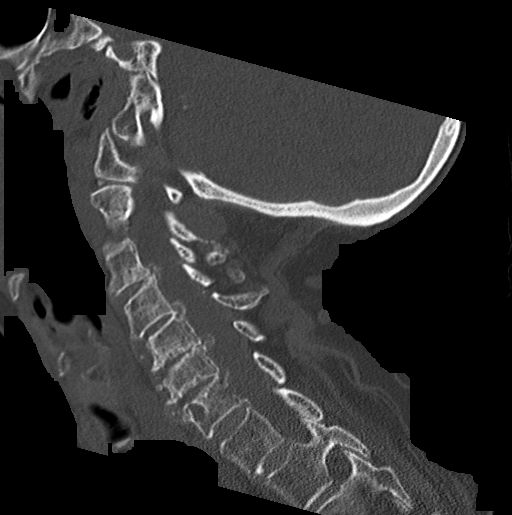
[im 48/72  bone]
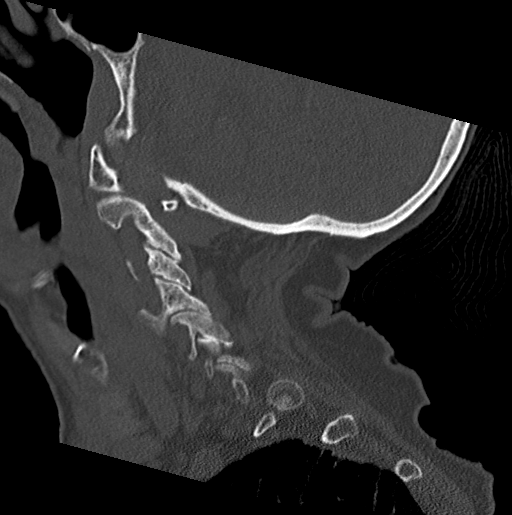
[im 60/72  bone]
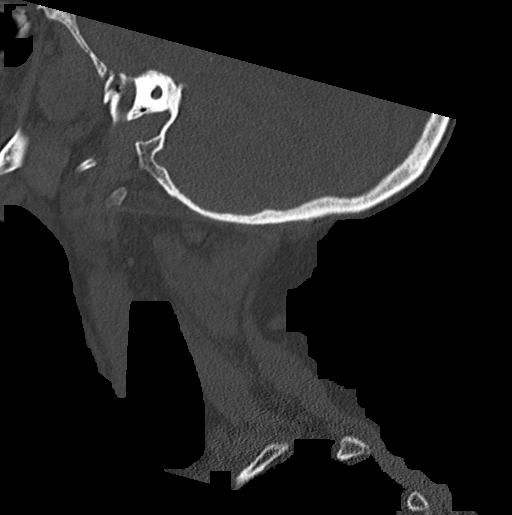

[Series 12: coronal soft tissue · coronal · 0.31mm/px · 3 of 67 slices shown]
[im 17/67  bone]
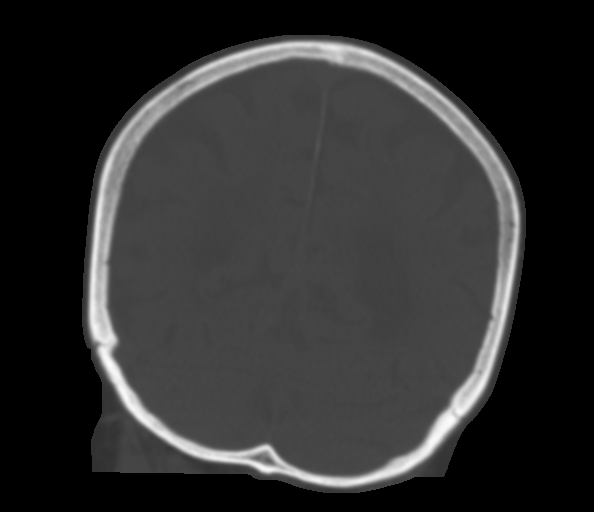
[im 34/67  bone]
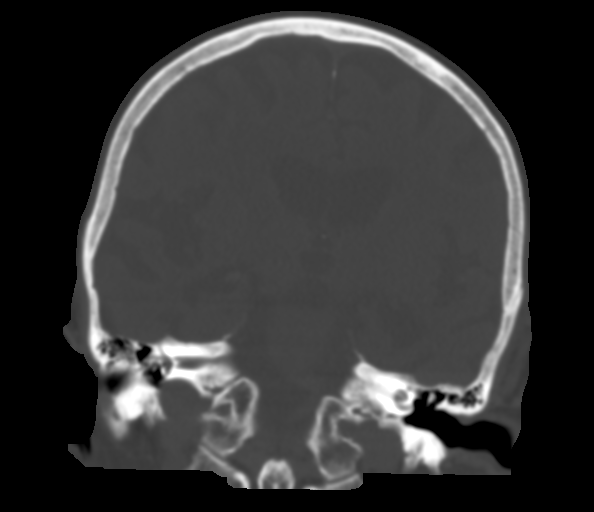
[im 50/67  bone]
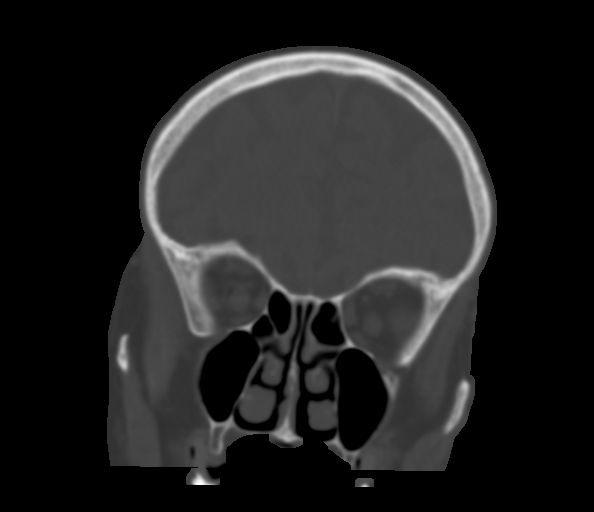

[13 of 33 positions shown; findings below may reference images not displayed]

FINDINGS: CT HEAD FINDINGS

Brain: There is atrophy and chronic small vessel disease changes. No
acute intracranial abnormality. Specifically, no hemorrhage,
hydrocephalus, mass lesion, acute infarction, or significant
intracranial injury.

Vascular: No hyperdense vessel or unexpected calcification.

Skull: No acute calvarial abnormality.

Sinuses/Orbits: Postoperative changes in the left orbit. No acute
finding.

Other: None

CT CERVICAL SPINE FINDINGS

Alignment: Normal

Skull base and vertebrae: No fracture

Soft tissues and spinal canal: Prevertebral soft tissues are normal.
No epidural or paraspinal hematoma.

Disc levels: Diffuse degenerative disc disease with disc space
narrowing and spurring. Diffuse degenerative facet disease
bilaterally.

Upper chest: Scarring in the apices, right greater than left.

Other: Carotid artery calcifications.  No acute findings.
IMPRESSION: No acute intracranial abnormality.Atrophy, chronic microvascular
disease.

Diffuse degenerative disc and facet disease.  No acute findings.

## 2020-06-06 DEATH — deceased
